# Patient Record
Sex: Female | Born: 1965 | Race: White | Hispanic: No | Marital: Married | Smoking: Never smoker
Health system: Southern US, Community
[De-identification: ages and names within clinical notes are randomized; demographics above are authoritative.]

## PROBLEM LIST (undated history)

## (undated) DIAGNOSIS — K5792 Diverticulitis of intestine, part unspecified, without perforation or abscess without bleeding: Secondary | ICD-10-CM

## (undated) DIAGNOSIS — T7840XA Allergy, unspecified, initial encounter: Secondary | ICD-10-CM

## (undated) HISTORY — PX: OTHER SURGICAL HISTORY: SHX169

## (undated) HISTORY — DX: Diverticulitis of intestine, part unspecified, without perforation or abscess without bleeding: K57.92

## (undated) HISTORY — DX: Allergy, unspecified, initial encounter: T78.40XA

---

## 2005-04-09 ENCOUNTER — Other Ambulatory Visit: Admission: RE | Admit: 2005-04-09 | Discharge: 2005-04-09 | Payer: Self-pay | Admitting: Internal Medicine

## 2005-07-02 DIAGNOSIS — K5792 Diverticulitis of intestine, part unspecified, without perforation or abscess without bleeding: Secondary | ICD-10-CM

## 2005-07-02 HISTORY — DX: Diverticulitis of intestine, part unspecified, without perforation or abscess without bleeding: K57.92

## 2005-07-29 ENCOUNTER — Inpatient Hospital Stay (HOSPITAL_COMMUNITY): Admission: EM | Admit: 2005-07-29 | Discharge: 2005-08-01 | Payer: Self-pay | Admitting: Emergency Medicine

## 2005-08-27 ENCOUNTER — Encounter: Admission: RE | Admit: 2005-08-27 | Discharge: 2005-08-27 | Payer: Self-pay | Admitting: Internal Medicine

## 2006-02-11 ENCOUNTER — Encounter: Admission: RE | Admit: 2006-02-11 | Discharge: 2006-02-11 | Payer: Self-pay | Admitting: Internal Medicine

## 2006-10-14 ENCOUNTER — Encounter: Payer: Self-pay | Admitting: Family Medicine

## 2007-02-03 ENCOUNTER — Encounter: Payer: Self-pay | Admitting: Family Medicine

## 2007-02-03 ENCOUNTER — Other Ambulatory Visit: Admission: RE | Admit: 2007-02-03 | Discharge: 2007-02-03 | Payer: Self-pay | Admitting: Family Medicine

## 2007-02-03 ENCOUNTER — Ambulatory Visit: Payer: Self-pay | Admitting: Family Medicine

## 2007-02-03 DIAGNOSIS — Z9189 Other specified personal risk factors, not elsewhere classified: Secondary | ICD-10-CM | POA: Insufficient documentation

## 2007-02-03 DIAGNOSIS — J309 Allergic rhinitis, unspecified: Secondary | ICD-10-CM | POA: Insufficient documentation

## 2007-02-03 DIAGNOSIS — Z8719 Personal history of other diseases of the digestive system: Secondary | ICD-10-CM | POA: Insufficient documentation

## 2007-02-04 ENCOUNTER — Ambulatory Visit: Payer: Self-pay | Admitting: Family Medicine

## 2007-02-04 LAB — CONVERTED CEMR LAB
Glucose, Urine, Semiquant: NEGATIVE
Nitrite: NEGATIVE
Protein, U semiquant: NEGATIVE
Urobilinogen, UA: 0.2
pH: 6

## 2007-02-07 LAB — CONVERTED CEMR LAB
AST: 21 units/L (ref 0–37)
Albumin: 4.1 g/dL (ref 3.5–5.2)
Alkaline Phosphatase: 37 units/L — ABNORMAL LOW (ref 39–117)
BUN: 10 mg/dL (ref 6–23)
Calcium: 9.4 mg/dL (ref 8.4–10.5)
Chloride: 105 meq/L (ref 96–112)
Eosinophils Absolute: 0 10*3/uL (ref 0.0–0.6)
GFR calc Af Amer: 117 mL/min
GFR calc non Af Amer: 97 mL/min
HCT: 37.8 % (ref 36.0–46.0)
Hemoglobin: 13.2 g/dL (ref 12.0–15.0)
MCHC: 34.8 g/dL (ref 30.0–36.0)
MCV: 97.1 fL (ref 78.0–100.0)
Neutro Abs: 2.6 10*3/uL (ref 1.4–7.7)
RDW: 12.2 % (ref 11.5–14.6)
Total Protein: 7.3 g/dL (ref 6.0–8.3)
VLDL: 15 mg/dL (ref 0–40)
WBC: 4.6 10*3/uL (ref 4.5–10.5)

## 2007-02-17 ENCOUNTER — Encounter: Admission: RE | Admit: 2007-02-17 | Discharge: 2007-02-17 | Payer: Self-pay | Admitting: Family Medicine

## 2007-11-11 ENCOUNTER — Ambulatory Visit: Payer: Self-pay | Admitting: Family Medicine

## 2007-11-11 DIAGNOSIS — J019 Acute sinusitis, unspecified: Secondary | ICD-10-CM | POA: Insufficient documentation

## 2008-01-23 ENCOUNTER — Telehealth: Payer: Self-pay | Admitting: *Deleted

## 2008-02-02 ENCOUNTER — Ambulatory Visit: Payer: Self-pay | Admitting: Family Medicine

## 2008-02-02 LAB — CONVERTED CEMR LAB
Bilirubin Urine: NEGATIVE
Blood in Urine, dipstick: NEGATIVE
Protein, U semiquant: NEGATIVE
Urobilinogen, UA: 0.2
pH: 6

## 2008-02-03 LAB — CONVERTED CEMR LAB
ALT: 27 units/L (ref 0–35)
AST: 22 units/L (ref 0–37)
Alkaline Phosphatase: 33 units/L — ABNORMAL LOW (ref 39–117)
BUN: 14 mg/dL (ref 6–23)
Basophils Absolute: 0 10*3/uL (ref 0.0–0.1)
Basophils Relative: 0.9 % (ref 0.0–3.0)
Bilirubin, Direct: 0.1 mg/dL (ref 0.0–0.3)
Chloride: 103 meq/L (ref 96–112)
Eosinophils Absolute: 0.1 10*3/uL (ref 0.0–0.7)
GFR calc Af Amer: 100 mL/min
HCT: 39.5 % (ref 36.0–46.0)
Hemoglobin: 13.8 g/dL (ref 12.0–15.0)
MCV: 98.8 fL (ref 78.0–100.0)
Monocytes Absolute: 0.5 10*3/uL (ref 0.1–1.0)
Neutro Abs: 2.9 10*3/uL (ref 1.4–7.7)
Neutrophils Relative %: 57.3 % (ref 43.0–77.0)
Potassium: 4 meq/L (ref 3.5–5.1)
RBC: 4 M/uL (ref 3.87–5.11)
RDW: 12.3 % (ref 11.5–14.6)
Sodium: 139 meq/L (ref 135–145)
Total Bilirubin: 1.3 mg/dL — ABNORMAL HIGH (ref 0.3–1.2)
Total CHOL/HDL Ratio: 4.1

## 2008-02-09 ENCOUNTER — Other Ambulatory Visit: Admission: RE | Admit: 2008-02-09 | Discharge: 2008-02-09 | Payer: Self-pay | Admitting: Family Medicine

## 2008-02-09 ENCOUNTER — Ambulatory Visit: Payer: Self-pay | Admitting: Family Medicine

## 2008-02-09 ENCOUNTER — Encounter: Payer: Self-pay | Admitting: Family Medicine

## 2008-02-18 ENCOUNTER — Encounter: Admission: RE | Admit: 2008-02-18 | Discharge: 2008-02-18 | Payer: Self-pay | Admitting: Family Medicine

## 2009-12-19 ENCOUNTER — Encounter: Admission: RE | Admit: 2009-12-19 | Discharge: 2009-12-19 | Payer: Self-pay | Admitting: Family Medicine

## 2009-12-22 ENCOUNTER — Encounter: Admission: RE | Admit: 2009-12-22 | Discharge: 2009-12-22 | Payer: Self-pay | Admitting: Family Medicine

## 2009-12-22 ENCOUNTER — Encounter: Payer: Self-pay | Admitting: Family Medicine

## 2010-02-06 ENCOUNTER — Ambulatory Visit: Payer: Self-pay | Admitting: Family Medicine

## 2010-02-06 LAB — CONVERTED CEMR LAB
Ketones, urine, test strip: NEGATIVE
Nitrite: NEGATIVE
Protein, U semiquant: NEGATIVE
WBC Urine, dipstick: NEGATIVE

## 2010-02-07 LAB — CONVERTED CEMR LAB
AST: 19 units/L (ref 0–37)
Albumin: 4 g/dL (ref 3.5–5.2)
Alkaline Phosphatase: 39 units/L (ref 39–117)
BUN: 14 mg/dL (ref 6–23)
Basophils Relative: 0.7 % (ref 0.0–3.0)
Bilirubin, Direct: 0.1 mg/dL (ref 0.0–0.3)
Calcium: 8.8 mg/dL (ref 8.4–10.5)
Chloride: 103 meq/L (ref 96–112)
Cholesterol: 183 mg/dL (ref 0–200)
Creatinine, Ser: 0.7 mg/dL (ref 0.4–1.2)
Eosinophils Absolute: 0.1 10*3/uL (ref 0.0–0.7)
Eosinophils Relative: 1.3 % (ref 0.0–5.0)
Glucose, Bld: 80 mg/dL (ref 70–99)
HCT: 39.5 % (ref 36.0–46.0)
MCHC: 34.5 g/dL (ref 30.0–36.0)
Monocytes Absolute: 0.5 10*3/uL (ref 0.1–1.0)
Neutrophils Relative %: 55.6 % (ref 43.0–77.0)
Platelets: 249 10*3/uL (ref 150.0–400.0)
Potassium: 4.1 meq/L (ref 3.5–5.1)
RBC: 4.03 M/uL (ref 3.87–5.11)
RDW: 13.4 % (ref 11.5–14.6)
Sodium: 139 meq/L (ref 135–145)
Total CHOL/HDL Ratio: 4

## 2010-02-14 ENCOUNTER — Ambulatory Visit: Payer: Self-pay | Admitting: Family Medicine

## 2010-06-21 ENCOUNTER — Encounter: Payer: Self-pay | Admitting: Family Medicine

## 2010-08-01 NOTE — Letter (Signed)
Summary: Records from [facility] 2002 - 2008  Records from [facility] 2002 - 2008   Imported By: Maryln Gottron 12/23/2009 10:51:57  _____________________________________________________________________  External Attachment:    Type:   Image     Comment:   External Document

## 2010-08-01 NOTE — Assessment & Plan Note (Signed)
Summary: CPX // RS   Vital Signs:  Patient profile:   45 year old female Weight:      131 pounds BMI:     24.05 BP sitting:   112 / 86  (left arm) Cuff size:   regular  Vitals Entered By: Raechel Ache, RN (February 14, 2010 9:02 AM) CC: CPX, labs done.    History of Present Illness: 45 yr old female for a cpx. She feels good and has no concerns. She is currently on her menses.   Allergies (verified): No Known Drug Allergies  Past History:  Past Medical History: Allergic rhinitis Diverticulitis, hx of. Hospitalized in 2007, had IV antibiotics. Had barium enema 2007-few diverticulae only. svd x 2                                                                                                                                                                       sees Dr. Campbell Stall yearly for skin checks.  Past Surgical History: Reviewed history from 02/03/2007 and no changes required. Denies surgical history  Family History: Reviewed history from 02/03/2007 and no changes required. Family History Diabetes 1st degree relative Family History High cholesterol Family History of Neurological disorder (daughter has cerebral palsy and father has multiple sclerosis)  Social History: Reviewed history from 02/03/2007 and no changes required. 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[employer] Married Never Smoked Alcohol use-no Regular exercise-yes  Review of Systems  The patient denies anorexia, fever, weight loss, weight gain, vision loss, decreased hearing, hoarseness, chest pain, syncope, dyspnea on exertion, peripheral edema, prolonged cough, headaches, hemoptysis, abdominal pain, melena, hematochezia, severe indigestion/heartburn, hematuria, incontinence, genital sores, muscle weakness, suspicious skin lesions, transient blindness, difficulty walking, depression, unusual weight change, abnormal bleeding, enlarged lymph nodes, angioedema, breast masses, and testicular  masses.    Physical Exam  General:  Well-developed,well-nourished,in no acute distress; alert,appropriate and cooperative throughout examination Head:  Normocephalic and atraumatic without obvious abnormalities. No apparent alopecia or balding. Eyes:  No corneal or conjunctival inflammation noted. EOMI. Perrla. Funduscopic exam benign, without hemorrhages, exudates or papilledema. Vision grossly normal. Ears:  External ear exam shows no significant lesions or deformities.  Otoscopic examination reveals clear canals, tympanic membranes are intact bilaterally without bulging, retraction, inflammation or discharge. Hearing is grossly normal bilaterally. Nose:  External nasal examination shows no deformity or inflammation. Nasal mucosa are pink and moist without lesions or exudates. Mouth:  Oral mucosa and oropharynx without lesions or exudates.  Teeth in good repair. Neck:  No deformities, masses, or tenderness noted. Chest Wall:  No deformities, masses, or tenderness noted. Lungs:  Normal respiratory effort, chest expands symmetrically. Lungs are clear to auscultation, no crackles or wheezes. Heart:  Normal rate and regular rhythm.  S1 and S2 normal without gallop, murmur, click, rub or other extra sounds. Abdomen:  Bowel sounds positive,abdomen soft and non-tender without masses, organomegaly or hernias noted. Msk:  No deformity or scoliosis noted of thoracic or lumbar spine.   Pulses:  R and L carotid,radial,femoral,dorsalis pedis and posterior tibial pulses are full and equal bilaterally Extremities:  No clubbing, cyanosis, edema, or deformity noted with normal full range of motion of all joints.   Neurologic:  No cranial nerve deficits noted. Station and gait are normal. Plantar reflexes are down-going bilaterally. DTRs are symmetrical throughout. Sensory, motor and coordinative functions appear intact. Skin:  Intact without suspicious lesions or rashes Cervical Nodes:  No lymphadenopathy  noted Axillary Nodes:  No palpable lymphadenopathy Inguinal Nodes:  No significant adenopathy Psych:  Cognition and judgment appear intact. Alert and cooperative with normal attention span and concentration. No apparent delusions, illusions, hallucinations   Impression & Recommendations:  Problem # 1:  EXAMINATION, ROUTINE MEDICAL (ICD-V70.0)  Complete Medication List: 1)  Multivitamins Tabs (Multiple vitamin) .Marland Kitchen.. 1 by mouth once daily  Patient Instructions: 1)  She will set up a Pap smear soon

## 2010-08-03 NOTE — Letter (Signed)
Summary: Flu shot record  Flu shot record   Imported By: Everrett Coombe 06/21/2010 13:10:57  _____________________________________________________________________  External Attachment:    Type:   Image     Comment:   External Document

## 2010-08-21 ENCOUNTER — Encounter: Payer: Self-pay | Admitting: Internal Medicine

## 2010-08-21 ENCOUNTER — Ambulatory Visit (INDEPENDENT_AMBULATORY_CARE_PROVIDER_SITE_OTHER): Payer: BC Managed Care – PPO | Admitting: Internal Medicine

## 2010-08-21 DIAGNOSIS — M542 Cervicalgia: Secondary | ICD-10-CM | POA: Insufficient documentation

## 2010-08-21 DIAGNOSIS — J069 Acute upper respiratory infection, unspecified: Secondary | ICD-10-CM | POA: Insufficient documentation

## 2010-08-21 MED ORDER — CYCLOBENZAPRINE HCL 5 MG PO TABS: 5.0000 mg | ORAL_TABLET | Freq: Three times a day (TID) | ORAL | Status: AC | PRN

## 2010-08-21 NOTE — Progress Notes (Signed)
  Subjective:    Patient ID: Kristen Farley, female    DOB: 07-02-1966, 45 y.o.   MRN: [id number]  HPI Pt is a pleasant 46 yo female who presents to clinic for evaluation of neck pain. States 1 wk h/o right anterolateral neck pain and trapezieus pain. No h/o injury or trauma. No radicular sx of arm or numbness/tingling. Has attempted tylenol prn, massager to neck, heating pad and neck exercises without significant improvement. Also notes 2wk h/o rhinorrhea and cough now NP. States sx are improving spontaneously and has no f/c. +sick exposures with two family members. No alleviating or exacerbating factors. No other current complaints.  Reviewed PMH, medications and allergies.     Review of Systems  Constitutional: Negative for fever, chills and fatigue.  HENT: Positive for congestion and rhinorrhea. Negative for ear pain.   Respiratory: Positive for cough. Negative for shortness of breath and wheezing.   Musculoskeletal: Positive for myalgias. Negative for back pain and arthralgias.  Skin: Negative for rash.       Objective:   Physical Exam  Constitutional: She appears well-developed and well-nourished. No distress.  HENT:  Head: Normocephalic and atraumatic.  Right Ear: External ear normal.  Left Ear: External ear normal.  Nose: Nose normal.  Mouth/Throat: Oropharynx is clear and moist. No oropharyngeal exudate.  Eyes: Conjunctivae are normal. No scleral icterus.  Neck: Neck supple.  Musculoskeletal:       Muscle tenderness right anterolateral neck in muscle distribution. No current overt spasm. FROM of neck. No midline post neck tenderness or bony abn. +tenderness to palpation right trapezius muscle. FROM right arm.  Skin: Skin is warm and dry. No rash noted. She is not diaphoretic. No erythema.          Assessment & Plan:

## 2010-08-21 NOTE — Assessment & Plan Note (Signed)
S/s most suggestive of msk etiology with muscle pain and spasm. Continue heating pad prn. Attempt flexeril prn and cautioned regarding possible sedating effect. Attempt otc nsaid with food and no other nsaids. Followup if no improvement or worsening.

## 2010-08-21 NOTE — Assessment & Plan Note (Signed)
Spontaneous improvement. Suspect resolving viral infxn. OTC sx relief prn. Followup if no improvement or worsening.

## 2010-11-17 NOTE — H&P (Signed)
[name], [name] NO.:  [id number]   MEDICAL RECORD NO.:  [id number]          PATIENT TYPE:  EMS   LOCATION:  ED                           FACILITY:  [facility]   PHYSICIAN:  Theressa Millard, M.D.    DATE OF BIRTH:  29-Sep-1965   DATE OF ADMISSION:  07/29/2005  DATE OF DISCHARGE:                                HISTORY & PHYSICAL   Kristen Farley is a 45 year old white female admitted with an inflammatory mass  in the right colon.  She developed flu-like symptoms 2 days ago.  Yesterday  had generalized abdominal discomfort.  Today and last night had increasing  pain localizing to the right abdomen.  No nausea and vomiting. She is  passing flatus.  No bowel movement x2 days.  She has no past history of  similar problems, and she has never had an appendectomy.   PAST MEDICAL HISTORY:  Medical: None.   Surgical: Plastic surgery, repair of lacerations to the knee.   ALLERGIES:  No known drug allergies.   MEDICATIONS:  None.   SOCIAL HISTORY:  Cigarettes: None.  Alcohol: Rare.  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   FAMILY HISTORY:  Father has MS.  Daughter has cerebral palsy.  No family  history of colon cancer.   REVIEW OF SYSTEMS:  All other systems are negative.   PHYSICAL EXAMINATION:  GENERAL: Well-developed, well-nourished, in no  distress.  VITAL SIGNS:  Blood pressure 117/86, pulse 93, temperature 98.8.  HEENT:  She has pupils which are round and reactive to light.  Extraocular  movements are intact.  Ears are normal.  Nose and throat are unremarkable.  NECK: Supple.  Thyroid is not enlarged or tender.  CHEST: Clear to auscultation and percussion.  CARDIAC:  Normal S1 and S2 without S3, S4, murmur, rub, or click.  ABDOMEN: Soft with moderate tenderness on the right and mild tenderness on  the left.  There is localized rebound tenderness on the right.   LABORATORY DATA:  White count 14,000, hemoglobin 14. UA negative.  Urine  pregnancy test negative.   BMET normal.  LFTs are normal except bilirubin of  1.6.   CT scan shows an inflammatory mass on the right colon.  Cannot completely  exclude a tumor.   IMPRESSION:  Inflammatory mass on the right colon. Differential diagnoses  include right-sided diverticulosis plus or minus abscess, colitis, other  inflammatory process.  Doubt tumor or cancer.   PLAN:  1.  The patient will be admitted.  2.  Given IV fluids and help n.p.o. except for ice chips.  3.  Treat with Flagyl and Rocephin.  4.  Surgical consultation will be obtained.      Theressa Millard, M.D.  Electronically Signed     JO/MEDQ  D:  07/29/2005  T:  07/29/2005  Job:  [id number]   cc:   Marcene Duos, M.D.  Fax: [phone]

## 2010-11-17 NOTE — Discharge Summary (Signed)
Kristen Farley, Kristen Farley                ACCOUNT NO.:  [id number]   MEDICAL RECORD NO.:  [id number]          PATIENT TYPE:  INP   LOCATION:  1606                         FACILITY:  [facility]   PHYSICIAN:  Theressa Millard, M.D.    DATE OF BIRTH:  1965-12-18   DATE OF ADMISSION:  07/29/2005  DATE OF DISCHARGE:  08/01/2005                                 DISCHARGE SUMMARY   ADMISSION DIAGNOSIS:  Inflammatory mass, right colon.   DISCHARGE DIAGNOSIS:  Inflammatory mass, right colon, ? etiology.   The patient is a 45 year old white female who felt flulike symptoms 2 days  prior to admission and then developed increasing abdominal pain and came to  the emergency department.   HOSPITAL COURSE:  The patient was admitted after a CT scan showed evidence  of an inflammatory mass in the right colon. A tumor could not be completely  excluded. Differential diagnosis included right sided diverticulitis,  plus/minus abscess, other colitis or other inflammatory process. She was  admitted, held n.p.o., treated with Unasyn and Flagyl. Over the subsequent  several days, she improved considerably with dramatic improvement in her  discomfort and pain. Her white count which was initially mildly elevated at  14,000 returned to normal. She was able to eat and not have significant  problems and so she was discharged in improved condition.   DISCHARGE MEDICATIONS:  1.  Augmentin 875 mg b.i.d. x7 days.  2.  Flagyl 250 mg t.i.d. x7 days.   DIET:  She is to eat a low residue diet.   FOLLOW UP:  She will call to make an appointment to see Marcene Duos, M.D. in 1-2 weeks as well as Anselm Pancoast. Weatherly, M.D. in 1-2  weeks.   ACTIVITY:  As tolerated.      Theressa Millard, M.D.  Electronically Signed     JO/MEDQ  D:  09/07/2005  T:  09/07/2005  Job:  [id number]

## 2011-01-10 ENCOUNTER — Other Ambulatory Visit: Payer: Self-pay | Admitting: Family Medicine

## 2011-01-10 DIAGNOSIS — Z1231 Encounter for screening mammogram for malignant neoplasm of breast: Secondary | ICD-10-CM

## 2011-01-16 ENCOUNTER — Ambulatory Visit
Admission: RE | Admit: 2011-01-16 | Discharge: 2011-01-16 | Disposition: A | Discharge status: Home / Self Care | Payer: BC Managed Care – PPO | Source: Ambulatory Visit | Attending: Family Medicine | Admitting: Family Medicine

## 2011-01-16 DIAGNOSIS — Z1231 Encounter for screening mammogram for malignant neoplasm of breast: Secondary | ICD-10-CM

## 2011-07-17 ENCOUNTER — Encounter: Payer: Self-pay | Admitting: Family

## 2011-07-17 ENCOUNTER — Ambulatory Visit (INDEPENDENT_AMBULATORY_CARE_PROVIDER_SITE_OTHER): Payer: BC Managed Care – PPO | Admitting: Family

## 2011-07-17 DIAGNOSIS — Z23 Encounter for immunization: Secondary | ICD-10-CM

## 2011-07-17 DIAGNOSIS — L259 Unspecified contact dermatitis, unspecified cause: Secondary | ICD-10-CM

## 2011-07-17 MED ORDER — METHYLPREDNISOLONE ACETATE 80 MG/ML IJ SUSP
80.0000 mg | Freq: Once | INTRAMUSCULAR | Status: AC
  Administered 2011-07-17: 80 mg via INTRAMUSCULAR

## 2011-07-17 NOTE — Patient Instructions (Signed)
Contact Dermatitis Contact dermatitis is a reaction to certain substances that touch the skin. Contact dermatitis can be either irritant contact dermatitis or allergic contact dermatitis. Irritant contact dermatitis does not require previous exposure to the substance for a reaction to occur. Allergic contact dermatitis only occurs if you have been exposed to the substance before. Upon a repeat exposure, your body reacts to the substance.   CAUSES   Many substances can cause contact dermatitis. Irritant dermatitis is most commonly caused by repeated exposure to mildly irritating substances, such as:  Makeup.     Soaps.    Detergents.    Bleaches.    Acids.    Metal salts, such as nickel.  Allergic contact dermatitis is most commonly caused by exposure to:  Poisonous plants.     Chemicals (deodorants, shampoos).     Jewelry.    Latex.    Neomycin in triple antibiotic cream.     Preservatives in products, including clothing.  SYMPTOMS   The area of skin that is exposed may develop:  Dryness or flaking.     Redness.    Cracks.    Itching.    Pain or a burning sensation.     Blisters.  With allergic contact dermatitis, there may also be swelling in areas such as the eyelids, mouth, or genitals.   DIAGNOSIS   Your caregiver can usually tell what the problem is by doing a physical exam. In cases where the cause is uncertain and an allergic contact dermatitis is suspected, a patch skin test may be performed to help determine the cause of your dermatitis. TREATMENT Treatment includes protecting the skin from further contact with the irritating substance by avoiding that substance if possible. Barrier creams, powders, and gloves may be helpful. Your caregiver may also recommend:  Steroid creams or ointments applied 2 times daily. For best results, soak the rash area in cool water for 20 minutes. Then apply the medicine. Cover the area with a plastic wrap. You can store the  steroid cream in the refrigerator for a "chilly" effect on your rash. That may decrease itching. Oral steroid medicines may be needed in more severe cases.     Antibiotics or antibacterial ointments if a skin infection is present.     Antihistamine lotion or an antihistamine taken by mouth to ease itching.     Lubricants to keep moisture in your skin.     Burow's solution to reduce redness and soreness or to dry a weeping rash. Mix one packet or tablet of solution in 2 cups cool water. Dip a clean washcloth in the mixture, wring it out a bit, and put it on the affected area. Leave the cloth in place for 30 minutes. Do this as often as possible throughout the day.     Taking several cornstarch or baking soda baths daily if the area is too large to cover with a washcloth.  Harsh chemicals, such as alkalis or acids, can cause skin damage that is like a burn. You should flush your skin for 15 to 20 minutes with cold water after such an exposure. You should also seek immediate medical care after exposure. Bandages (dressings), antibiotics, and pain medicine may be needed for severely irritated skin.   HOME CARE INSTRUCTIONS  Avoid the substance that caused your reaction.     Keep the area of skin that is affected away from hot water, soap, sunlight, chemicals, acidic substances, or anything else that would irritate your skin.       Do not scratch the rash. Scratching may cause the rash to become infected.     You may take cool baths to help stop the itching.     Only take over-the-counter or prescription medicines as directed by your caregiver.     See your caregiver for follow-up care as directed to make sure your skin is healing properly.  SEEK MEDICAL CARE IF:    Your condition is not better after 3 days of treatment.     You seem to be getting worse.     You see signs of infection such as swelling, tenderness, redness, soreness, or warmth in the affected area.     You have any problems  related to your medicines.  Document Released: 06/15/2000 Document Revised: 02/28/2011 Document Reviewed: 11/21/2010 [employer] Patient Information 2012 [employer]. 

## 2011-07-17 NOTE — Progress Notes (Signed)
Subjective:    Patient ID: Kristen Farley, female    DOB: 1965/12/23, 46 y.o.   MRN: [id number]  HPI 46 year old white female, nonsmoker, patient of Dr. Clent Ridges is in today with complaints for it she swollen, red face. She woke up this morning with this reaction. She is unsure what the reaction is to. Shortly after she began to have tongue swelling that has resolved after taking Benadryl 25 mg. She denies any changes in detergents, soaps, lotions, medications, or environmental changes.   Review of Systems  Constitutional: Negative.   HENT: Positive for facial swelling.        Face red and mildly swollen  Eyes: Negative.   Respiratory: Negative.  Negative for shortness of breath, wheezing and stridor.   Cardiovascular: Negative.  Negative for chest pain.  Genitourinary: Negative.   Musculoskeletal: Negative.   Skin: Negative.   Neurological: Negative.   Hematological: Negative.   Psychiatric/Behavioral: Negative.        Past Medical History  Diagnosis Date  . Allergy   . Diverticulitis 2007    History   Social History  . Marital Status: Married    Spouse Name: N/A    Number of Children: N/A  . Years of Education: N/A   Occupational History  . Not on file.   Social History Main Topics  . Smoking status: Never Smoker   . Smokeless tobacco: Not on file  . Alcohol Use: Not on file  . Drug Use: Not on file  . Sexually Active: Not on file   Other Topics Concern  . Not on file   Social History Narrative  . No narrative on file    No past surgical history on file.  Family History  Problem Relation Age of Onset  . Multiple sclerosis Father   . Cerebral palsy Daughter   . Diabetes      family hx  . Hyperlipidemia      family hx    No Known Allergies  Current Outpatient Prescriptions on File Prior to Visit  Medication Sig Dispense Refill  . Multiple Vitamin (MULTIVITAMIN) tablet Take 1 tablet by mouth daily.        . cyclobenzaprine (FLEXERIL) 5 MG tablet Take 1  tablet (5 mg total) by mouth every 8 (eight) hours as needed for Muscle spasms.  30 tablet  0   No current facility-administered medications on file prior to visit.    BP 138/100  Temp(Src) 97.8 F (36.6 C) (Oral)  Wt 146 lb (66.225 kg)chart Objective:   Physical Exam  Constitutional: She is oriented to person, place, and time. She appears well-developed and well-nourished.  HENT:  Right Ear: External ear normal.  Left Ear: External ear normal.  Nose: Nose normal.  Mouth/Throat: Oropharynx is clear and moist.       Mild facial swelling and redness noted. Tongue normal. Good air movement through the neck.  Neck: Normal range of motion. Neck supple.       Red rash noted to the neck.  Cardiovascular: Normal rate, regular rhythm and normal heart sounds.   Pulmonary/Chest: Effort normal and breath sounds normal. No respiratory distress. She has no wheezes.  Musculoskeletal: Normal range of motion.  Neurological: She is alert and oriented to person, place, and time.  Skin: Skin is warm and dry. Rash noted. There is erythema.       Erythematous, rash noted to the face and neck.          Assessment & Plan:  Assessment:  Allergic reaction, angioedema-mild  Plan: Depo-Medrol 80 mg IM x1. Continue Benadryl 25 mg when necessary. Patient has no signs or symptoms of airway obstruction but advised if she becomes short of breath or swelling worsened she is to go to the emergency room immediately. Followup with PCP as scheduled and sooner when necessary.

## 2011-07-18 ENCOUNTER — Telehealth: Payer: Self-pay | Admitting: *Deleted

## 2011-07-18 MED ORDER — PREDNISONE 20 MG PO TABS: ORAL_TABLET | ORAL | Status: AC

## 2011-07-18 NOTE — Telephone Encounter (Signed)
Faxed RX for prednisone in to the pharmacy. Please start taking med. Advise to be aware of possible triggers.

## 2011-07-18 NOTE — Telephone Encounter (Signed)
Pt is calling back today stating she is having exactly the same symptoms as yesterday with the facial swelling, itching, etc.  She has taken 50 mg. Of Benadryl, but would like to know what Oran Rein thinks she should do??

## 2011-12-25 ENCOUNTER — Other Ambulatory Visit: Payer: Self-pay | Admitting: Family Medicine

## 2011-12-25 DIAGNOSIS — Z1231 Encounter for screening mammogram for malignant neoplasm of breast: Secondary | ICD-10-CM

## 2012-01-29 ENCOUNTER — Ambulatory Visit
Admission: RE | Admit: 2012-01-29 | Discharge: 2012-01-29 | Disposition: A | Discharge status: Home / Self Care | Payer: BC Managed Care – PPO | Source: Ambulatory Visit | Attending: Family Medicine | Admitting: Family Medicine

## 2012-01-29 DIAGNOSIS — Z1231 Encounter for screening mammogram for malignant neoplasm of breast: Secondary | ICD-10-CM

## 2012-02-21 ENCOUNTER — Encounter: Payer: Self-pay | Admitting: Family Medicine

## 2012-02-21 ENCOUNTER — Ambulatory Visit (INDEPENDENT_AMBULATORY_CARE_PROVIDER_SITE_OTHER): Payer: BC Managed Care – PPO | Admitting: Family Medicine

## 2012-02-21 VITALS — BP 120/76 | HR 75 | Temp 98.4°F | Wt 132.0 lb

## 2012-02-21 DIAGNOSIS — L259 Unspecified contact dermatitis, unspecified cause: Secondary | ICD-10-CM

## 2012-02-21 MED ORDER — METHYLPREDNISOLONE ACETATE 80 MG/ML IJ SUSP
120.0000 mg | Freq: Once | INTRAMUSCULAR | Status: AC
  Administered 2012-02-21: 120 mg via INTRAMUSCULAR

## 2012-02-21 NOTE — Progress Notes (Signed)
  Subjective:    Patient ID: Kristen Farley, female    DOB: [birth year], 46 y.o.   MRN: [id number]  HPI Here for 3 days of itchy spots on the face, neck , and arms. She was working in the yard last weekend.   Review of Systems  Constitutional: Negative.   Skin: Positive for rash.       Objective:   Physical Exam  Constitutional: She appears well-developed and well-nourished.  Skin:       Patches of red papules and vesicles as above           Assessment & Plan:  Add Zyrtec bid

## 2012-02-21 NOTE — Addendum Note (Signed)
Addended by: Aniceto Boss A on: 02/21/2012 10:17 AM   Modules accepted: Orders

## 2012-07-08 ENCOUNTER — Ambulatory Visit: Payer: BC Managed Care – PPO | Admitting: Family Medicine

## 2012-07-09 ENCOUNTER — Ambulatory Visit (INDEPENDENT_AMBULATORY_CARE_PROVIDER_SITE_OTHER): Payer: BC Managed Care – PPO | Admitting: Family Medicine

## 2012-07-09 DIAGNOSIS — Z23 Encounter for immunization: Secondary | ICD-10-CM

## 2012-12-22 ENCOUNTER — Other Ambulatory Visit (INDEPENDENT_AMBULATORY_CARE_PROVIDER_SITE_OTHER): Payer: BC Managed Care – PPO

## 2012-12-22 DIAGNOSIS — Z0000 Encounter for general adult medical examination without abnormal findings: Secondary | ICD-10-CM

## 2012-12-22 LAB — POCT URINALYSIS DIPSTICK
Bilirubin, UA: NEGATIVE
Glucose, UA: NEGATIVE
Ketones, UA: NEGATIVE
Nitrite, UA: NEGATIVE

## 2012-12-22 LAB — CBC WITH DIFFERENTIAL/PLATELET
Basophils Absolute: 0 10*3/uL (ref 0.0–0.1)
Basophils Relative: 0.8 % (ref 0.0–3.0)
Eosinophils Relative: 2.4 % (ref 0.0–5.0)
Lymphocytes Relative: 32.3 % (ref 12.0–46.0)
MCHC: 33.8 g/dL (ref 30.0–36.0)
Neutro Abs: 2.5 10*3/uL (ref 1.4–7.7)
Neutrophils Relative %: 54.1 % (ref 43.0–77.0)
RBC: 4.13 Mil/uL (ref 3.87–5.11)
WBC: 4.6 10*3/uL (ref 4.5–10.5)

## 2012-12-22 LAB — LIPID PANEL
Total CHOL/HDL Ratio: 3
Triglycerides: 105 mg/dL (ref 0.0–149.0)
VLDL: 21 mg/dL (ref 0.0–40.0)

## 2012-12-22 LAB — HEPATIC FUNCTION PANEL
Albumin: 4 g/dL (ref 3.5–5.2)
Total Bilirubin: 1 mg/dL (ref 0.3–1.2)

## 2012-12-22 LAB — BASIC METABOLIC PANEL
BUN: 11 mg/dL (ref 6–23)
Chloride: 103 mEq/L (ref 96–112)
Creatinine, Ser: 0.7 mg/dL (ref 0.4–1.2)
Glucose, Bld: 92 mg/dL (ref 70–99)
Potassium: 4.2 mEq/L (ref 3.5–5.1)

## 2012-12-22 LAB — LDL CHOLESTEROL, DIRECT: Direct LDL: 119.2 mg/dL

## 2012-12-25 NOTE — Progress Notes (Signed)
Quick Note:  Pt has appointment on 12/29/12 will go over then. ______

## 2012-12-29 ENCOUNTER — Ambulatory Visit (INDEPENDENT_AMBULATORY_CARE_PROVIDER_SITE_OTHER): Payer: BC Managed Care – PPO | Admitting: Family Medicine

## 2012-12-29 ENCOUNTER — Encounter: Payer: Self-pay | Admitting: Family Medicine

## 2012-12-29 ENCOUNTER — Other Ambulatory Visit (HOSPITAL_COMMUNITY)
Admission: RE | Admit: 2012-12-29 | Discharge: 2012-12-29 | Disposition: A | Discharge status: Home / Self Care | Payer: BC Managed Care – PPO | Source: Ambulatory Visit | Attending: Family Medicine | Admitting: Family Medicine

## 2012-12-29 VITALS — BP 120/74 | HR 84 | Temp 98.4°F | Ht 62.5 in | Wt 131.0 lb

## 2012-12-29 DIAGNOSIS — Z0000 Encounter for general adult medical examination without abnormal findings: Secondary | ICD-10-CM

## 2012-12-29 DIAGNOSIS — Z01419 Encounter for gynecological examination (general) (routine) without abnormal findings: Secondary | ICD-10-CM | POA: Insufficient documentation

## 2012-12-29 DIAGNOSIS — N920 Excessive and frequent menstruation with regular cycle: Secondary | ICD-10-CM

## 2012-12-29 DIAGNOSIS — H9190 Unspecified hearing loss, unspecified ear: Secondary | ICD-10-CM

## 2012-12-29 DIAGNOSIS — H9193 Unspecified hearing loss, bilateral: Secondary | ICD-10-CM

## 2012-12-29 NOTE — Progress Notes (Signed)
Subjective:    Patient ID: Kristen Farley, female    DOB: 08/25/1965, 47 y.o.   MRN: [id number]  HPI  47 yr old female for a cpx. She has two issues to discuss. First her menses have become very irregular over the past year. She often skips a month or two in between cycles. Sometimes her bleeding is light and sometimes it is heavy and has a lot of clotting. Her cramps are no more intense than usual. She recently had 6 weeks of frequent light bleeding which stopped a few days ago. No hot flashes. Second, she asks about decreased hearing, usually in crowded rooms, and about ringing in the ears over the past year. No ear pain or nausea or dizziness.    Review of Systems  Constitutional: Negative.  Negative for fever, diaphoresis, activity change, appetite change, fatigue and unexpected weight change.  HENT: Positive for hearing loss and tinnitus. Negative for ear pain, nosebleeds, congestion, sore throat, trouble swallowing, neck pain, neck stiffness and voice change.   Eyes: Negative.  Negative for photophobia, pain, discharge, redness and visual disturbance.  Respiratory: Negative.  Negative for apnea, cough, choking, chest tightness, shortness of breath, wheezing and stridor.   Cardiovascular: Negative.  Negative for chest pain, palpitations and leg swelling.  Gastrointestinal: Negative.  Negative for nausea, vomiting, abdominal pain, diarrhea, constipation, blood in stool, abdominal distention and rectal pain.  Genitourinary: Positive for vaginal bleeding and menstrual problem. Negative for dysuria, urgency, frequency, hematuria, flank pain, vaginal discharge, enuresis, difficulty urinating and vaginal pain.  Musculoskeletal: Negative.  Negative for myalgias, back pain, joint swelling, arthralgias and gait problem.  Skin: Negative.  Negative for color change, pallor, rash and wound.  Neurological: Negative.  Negative for dizziness, tremors, seizures, syncope, speech difficulty, weakness,  light-headedness, numbness and headaches.  Hematological: Negative for adenopathy. Does not bruise/bleed easily.  Psychiatric/Behavioral: Negative.  Negative for hallucinations, behavioral problems, confusion, sleep disturbance, dysphoric mood and agitation. The patient is not nervous/anxious.        Objective:   Physical Exam  Constitutional: She appears well-developed and well-nourished. No distress.  HENT:  Head: Normocephalic and atraumatic.  Right Ear: External ear normal.  Left Ear: External ear normal.  Nose: Nose normal.  Mouth/Throat: Oropharynx is clear and moist. No oropharyngeal exudate.  Eyes: Conjunctivae and EOM are normal. Pupils are equal, round, and reactive to light. Right eye exhibits no discharge. Left eye exhibits no discharge. No scleral icterus.  Neck: Normal range of motion. Neck supple. No JVD present. No thyromegaly present.  Cardiovascular: Normal rate, regular rhythm, normal heart sounds and intact distal pulses.  Exam reveals no gallop and no friction rub.   No murmur heard. Pulmonary/Chest: Effort normal and breath sounds normal. No stridor. No respiratory distress. She has no wheezes. She has no rales. She exhibits no tenderness.  Abdominal: Soft. Normal appearance and bowel sounds are normal. She exhibits no distension, no abdominal bruit, no ascites and no mass. There is no hepatosplenomegaly. There is no tenderness. There is no rigidity, no rebound and no guarding. No hernia.  Genitourinary: Rectum normal, vagina normal and uterus normal. No breast swelling, tenderness, discharge or bleeding. Cervix exhibits no motion tenderness, no discharge and no friability. Right adnexum displays no mass, no tenderness and no fullness. Left adnexum displays no mass, no tenderness and no fullness. No erythema, tenderness or bleeding around the vagina. No vaginal discharge found.  Musculoskeletal: Normal range of motion. She exhibits no edema and no tenderness.   Lymphadenopathy:  She has no cervical adenopathy.  Neurological: She is alert. She has normal reflexes. No cranial nerve deficit. She exhibits normal muscle tone. Coordination normal.  Skin: Skin is warm and dry. No rash noted. She is not diaphoretic. No erythema. No pallor.  Psychiatric: She has a normal mood and affect. Her behavior is normal. Judgment and thought content normal.          Assessment & Plan:  Well exam. 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 We will refer to ENT for audiologic evaluation. She is probably perimenopausal, and we discussed possible treatments like a dose dose BCP. She elected to not treat at this time. We will set up an [place] soon to evaluate her uterus and its endometrium. Pap smear is pending.

## 2013-01-05 MED ORDER — FLUCONAZOLE 150 MG PO TABS: 150.0000 mg | ORAL_TABLET | Freq: Once | ORAL | Status: DC

## 2013-01-05 NOTE — Addendum Note (Signed)
Addended by: Aniceto Boss A on: 01/05/2013 10:35 AM   Modules accepted: Orders

## 2013-01-05 NOTE — Progress Notes (Signed)
Quick Note:  I spoke with pt and sent script e-scribe. ______ 

## 2013-01-12 ENCOUNTER — Telehealth: Payer: Self-pay | Admitting: Family Medicine

## 2013-01-12 DIAGNOSIS — N920 Excessive and frequent menstruation with regular cycle: Secondary | ICD-10-CM

## 2013-01-12 NOTE — Telephone Encounter (Signed)
Had to add on a [place] of pelvis to [place] trans-vag order, which I did.

## 2013-01-14 ENCOUNTER — Other Ambulatory Visit: Payer: BC Managed Care – PPO

## 2013-01-19 ENCOUNTER — Ambulatory Visit
Admission: RE | Admit: 2013-01-19 | Discharge: 2013-01-19 | Disposition: A | Discharge status: Home / Self Care | Payer: BC Managed Care – PPO | Source: Ambulatory Visit | Attending: Family Medicine | Admitting: Family Medicine

## 2013-01-19 DIAGNOSIS — N920 Excessive and frequent menstruation with regular cycle: Secondary | ICD-10-CM

## 2013-01-20 NOTE — Progress Notes (Signed)
Quick Note:  I spoke with pt ______ 

## 2013-01-22 ENCOUNTER — Other Ambulatory Visit: Payer: Self-pay

## 2013-01-22 DIAGNOSIS — Z1231 Encounter for screening mammogram for malignant neoplasm of breast: Secondary | ICD-10-CM

## 2013-02-12 ENCOUNTER — Ambulatory Visit
Admission: RE | Admit: 2013-02-12 | Discharge: 2013-02-12 | Disposition: A | Discharge status: Home / Self Care | Payer: BC Managed Care – PPO | Source: Ambulatory Visit

## 2013-02-12 DIAGNOSIS — Z1231 Encounter for screening mammogram for malignant neoplasm of breast: Secondary | ICD-10-CM

## 2013-05-07 ENCOUNTER — Other Ambulatory Visit: Payer: Self-pay

## 2013-12-30 ENCOUNTER — Other Ambulatory Visit (INDEPENDENT_AMBULATORY_CARE_PROVIDER_SITE_OTHER): Payer: BC Managed Care – PPO

## 2013-12-30 DIAGNOSIS — Z0000 Encounter for general adult medical examination without abnormal findings: Secondary | ICD-10-CM

## 2013-12-30 LAB — BASIC METABOLIC PANEL
BUN: 11 mg/dL (ref 6–23)
CALCIUM: 8.8 mg/dL (ref 8.4–10.5)
CO2: 27 mEq/L (ref 19–32)
Chloride: 101 mEq/L (ref 96–112)
Creatinine, Ser: 0.6 mg/dL (ref 0.4–1.2)
GFR: 106.27 mL/min (ref >60.00)
GLUCOSE: 81 mg/dL (ref 70–99)
Potassium: 3.5 mEq/L (ref 3.5–5.1)
SODIUM: 137 meq/L (ref 135–145)

## 2013-12-30 LAB — LIPID PANEL
Cholesterol: 210 mg/dL — ABNORMAL HIGH (ref 0–200)
HDL: 73.1 mg/dL
LDL Cholesterol: 112 mg/dL — ABNORMAL HIGH (ref 0–99)
NonHDL: 136.9
Total CHOL/HDL Ratio: 3
Triglycerides: 124 mg/dL (ref 0.0–149.0)
VLDL: 24.8 mg/dL (ref 0.0–40.0)

## 2013-12-30 LAB — CBC WITH DIFFERENTIAL/PLATELET
Basophils Absolute: 0 K/uL (ref 0.0–0.1)
Basophils Relative: 0.5 % (ref 0.0–3.0)
Eosinophils Absolute: 0.1 K/uL (ref 0.0–0.7)
Eosinophils Relative: 1.8 % (ref 0.0–5.0)
HCT: 40.3 % (ref 36.0–46.0)
Hemoglobin: 13.6 g/dL (ref 12.0–15.0)
Lymphocytes Relative: 28.4 % (ref 12.0–46.0)
Lymphs Abs: 1.6 K/uL (ref 0.7–4.0)
MCHC: 33.8 g/dL (ref 30.0–36.0)
MCV: 97 fl (ref 78.0–100.0)
Monocytes Absolute: 0.6 K/uL (ref 0.1–1.0)
Monocytes Relative: 11.6 % (ref 3.0–12.0)
Neutro Abs: 3.2 K/uL (ref 1.4–7.7)
Neutrophils Relative %: 57.7 % (ref 43.0–77.0)
Platelets: 257 K/uL (ref 150.0–400.0)
RBC: 4.16 Mil/uL (ref 3.87–5.11)
RDW: 13.6 % (ref 11.5–15.5)
WBC: 5.6 K/uL (ref 4.0–10.5)

## 2013-12-30 LAB — POCT URINALYSIS DIPSTICK
Bilirubin, UA: NEGATIVE
Blood, UA: NEGATIVE
Glucose, UA: NEGATIVE
Ketones, UA: NEGATIVE
Leukocytes, UA: NEGATIVE
Nitrite, UA: NEGATIVE
Protein, UA: NEGATIVE
Spec Grav, UA: 1.015
Urobilinogen, UA: 0.2
pH, UA: 7.5

## 2013-12-30 LAB — HEPATIC FUNCTION PANEL
ALT: 38 U/L — ABNORMAL HIGH (ref 0–35)
AST: 38 U/L — AB (ref 0–37)
Albumin: 3.9 g/dL (ref 3.5–5.2)
Alkaline Phosphatase: 37 U/L — ABNORMAL LOW (ref 39–117)
BILIRUBIN DIRECT: 0.1 mg/dL (ref 0.0–0.3)
BILIRUBIN TOTAL: 0.7 mg/dL (ref 0.2–1.2)
Total Protein: 7 g/dL (ref 6.0–8.3)

## 2013-12-30 LAB — TSH: TSH: 2.83 u[IU]/mL (ref 0.35–4.50)

## 2014-01-13 ENCOUNTER — Encounter: Payer: Self-pay | Admitting: Family Medicine

## 2014-01-13 ENCOUNTER — Ambulatory Visit (INDEPENDENT_AMBULATORY_CARE_PROVIDER_SITE_OTHER): Payer: BC Managed Care – PPO | Admitting: Family Medicine

## 2014-01-13 VITALS — BP 122/82 | HR 83 | Temp 98.5°F | Ht 62.75 in | Wt 136.0 lb

## 2014-01-13 DIAGNOSIS — Z0000 Encounter for general adult medical examination without abnormal findings: Secondary | ICD-10-CM

## 2014-01-13 NOTE — Progress Notes (Signed)
Pre visit review using our clinic review tool, if applicable. No additional management support is needed unless otherwise documented below in the visit note. 

## 2014-01-13 NOTE — Progress Notes (Signed)
   Subjective:    Patient ID: Kristen Farley, female    DOB: [birth year], 48 y.o.   MRN: [id number]  HPI 48 yr old female for a cpx. She feels well. She plays tennis and walks for exercise.    Review of Systems  Constitutional: Negative.   HENT: Negative.   Eyes: Negative.   Respiratory: Negative.   Cardiovascular: Negative.   Gastrointestinal: Negative.   Genitourinary: Negative for dysuria, urgency, frequency, hematuria, flank pain, decreased urine volume, enuresis, difficulty urinating, pelvic pain and dyspareunia.  Musculoskeletal: Negative.   Skin: Negative.   Neurological: Negative.   Psychiatric/Behavioral: Negative.        Objective:   Physical Exam  Constitutional: She is oriented to person, place, and time. She appears well-developed and well-nourished. No distress.  HENT:  Head: Normocephalic and atraumatic.  Right Ear: External ear normal.  Left Ear: External ear normal.  Nose: Nose normal.  Mouth/Throat: Oropharynx is clear and moist. No oropharyngeal exudate.  Eyes: Conjunctivae and EOM are normal. Pupils are equal, round, and reactive to light. No scleral icterus.  Neck: Normal range of motion. Neck supple. No JVD present. No thyromegaly present.  Cardiovascular: Normal rate, regular rhythm, normal heart sounds and intact distal pulses.  Exam reveals no gallop and no friction rub.   No murmur heard. Pulmonary/Chest: Effort normal and breath sounds normal. No respiratory distress. She has no wheezes. She has no rales. She exhibits no tenderness.  Abdominal: Soft. Bowel sounds are normal. She exhibits no distension and no mass. There is no tenderness. There is no rebound and no guarding.  Musculoskeletal: Normal range of motion. She exhibits no edema and no tenderness.  Lymphadenopathy:    She has no cervical adenopathy.  Neurological: She is alert and oriented to person, place, and time. She has normal reflexes. No cranial nerve deficit. She exhibits normal muscle  tone. Coordination normal.  Skin: Skin is warm and dry. No rash noted. No erythema.  Psychiatric: She has a normal mood and affect. Her behavior is normal. Judgment and thought content normal.          Assessment & Plan:  Well exam

## 2014-01-13 NOTE — Addendum Note (Signed)
Addended by: Gershon CraneFRY, STEPHEN A on: 01/13/2014 11:00 AM   Modules accepted: Orders

## 2014-01-18 ENCOUNTER — Other Ambulatory Visit: Payer: Self-pay

## 2014-01-18 DIAGNOSIS — Z1231 Encounter for screening mammogram for malignant neoplasm of breast: Secondary | ICD-10-CM

## 2014-02-15 ENCOUNTER — Ambulatory Visit
Admission: RE | Admit: 2014-02-15 | Discharge: 2014-02-15 | Disposition: A | Discharge status: Home / Self Care | Payer: BC Managed Care – PPO | Source: Ambulatory Visit

## 2014-02-15 DIAGNOSIS — Z1231 Encounter for screening mammogram for malignant neoplasm of breast: Secondary | ICD-10-CM

## 2014-03-17 ENCOUNTER — Encounter: Payer: Self-pay | Admitting: Family Medicine

## 2015-01-24 ENCOUNTER — Other Ambulatory Visit: Payer: Self-pay | Admitting: Family Medicine

## 2015-01-24 DIAGNOSIS — Z1231 Encounter for screening mammogram for malignant neoplasm of breast: Secondary | ICD-10-CM

## 2015-02-25 ENCOUNTER — Ambulatory Visit
Admission: RE | Admit: 2015-02-25 | Discharge: 2015-02-25 | Disposition: A | Discharge status: Home / Self Care | Payer: BC Managed Care – PPO | Source: Ambulatory Visit | Attending: Family Medicine | Admitting: Family Medicine

## 2015-02-25 DIAGNOSIS — Z1231 Encounter for screening mammogram for malignant neoplasm of breast: Secondary | ICD-10-CM

## 2015-06-02 ENCOUNTER — Ambulatory Visit (INDEPENDENT_AMBULATORY_CARE_PROVIDER_SITE_OTHER): Payer: BC Managed Care – PPO

## 2015-06-02 DIAGNOSIS — Z23 Encounter for immunization: Secondary | ICD-10-CM

## 2015-11-23 ENCOUNTER — Other Ambulatory Visit: Payer: Self-pay | Admitting: Orthopedic Surgery

## 2015-11-23 DIAGNOSIS — M542 Cervicalgia: Secondary | ICD-10-CM

## 2015-12-02 ENCOUNTER — Ambulatory Visit
Admission: RE | Admit: 2015-12-02 | Discharge: 2015-12-02 | Disposition: A | Discharge status: Home / Self Care | Payer: BC Managed Care – PPO | Source: Ambulatory Visit | Attending: Orthopedic Surgery | Admitting: Orthopedic Surgery

## 2015-12-02 DIAGNOSIS — M542 Cervicalgia: Secondary | ICD-10-CM

## 2016-01-11 ENCOUNTER — Other Ambulatory Visit: Payer: BC Managed Care – PPO

## 2016-01-18 ENCOUNTER — Encounter: Payer: Self-pay | Admitting: Family Medicine

## 2016-01-18 ENCOUNTER — Encounter: Payer: BC Managed Care – PPO | Admitting: Family Medicine

## 2016-02-06 ENCOUNTER — Other Ambulatory Visit (INDEPENDENT_AMBULATORY_CARE_PROVIDER_SITE_OTHER): Payer: BC Managed Care – PPO

## 2016-02-06 DIAGNOSIS — Z0000 Encounter for general adult medical examination without abnormal findings: Secondary | ICD-10-CM | POA: Diagnosis not present

## 2016-02-06 LAB — BASIC METABOLIC PANEL
BUN: 12 mg/dL (ref 6–23)
CALCIUM: 9.8 mg/dL (ref 8.4–10.5)
CO2: 29 meq/L (ref 19–32)
CREATININE: 0.82 mg/dL (ref 0.40–1.20)
Chloride: 102 mEq/L (ref 96–112)
GFR: 77.75 mL/min (ref >60.00)
Glucose, Bld: 85 mg/dL (ref 70–99)
Potassium: 3.4 mEq/L — ABNORMAL LOW (ref 3.5–5.1)
SODIUM: 139 meq/L (ref 135–145)

## 2016-02-06 LAB — CBC WITH DIFFERENTIAL/PLATELET
BASOS ABS: 0 10*3/uL (ref 0.0–0.1)
BASOS PCT: 0.6 % (ref 0.0–3.0)
EOS ABS: 0.1 10*3/uL (ref 0.0–0.7)
Eosinophils Relative: 1.9 % (ref 0.0–5.0)
HEMATOCRIT: 41.5 % (ref 36.0–46.0)
HEMOGLOBIN: 14.2 g/dL (ref 12.0–15.0)
LYMPHS PCT: 40 % (ref 12.0–46.0)
Lymphs Abs: 1.9 10*3/uL (ref 0.7–4.0)
MCHC: 34.2 g/dL (ref 30.0–36.0)
MCV: 94 fl (ref 78.0–100.0)
Monocytes Absolute: 0.5 10*3/uL (ref 0.1–1.0)
Monocytes Relative: 9.9 % (ref 3.0–12.0)
Neutro Abs: 2.3 10*3/uL (ref 1.4–7.7)
Neutrophils Relative %: 47.6 % (ref 43.0–77.0)
Platelets: 278 10*3/uL (ref 150.0–400.0)
RBC: 4.41 Mil/uL (ref 3.87–5.11)
RDW: 13.4 % (ref 11.5–15.5)
WBC: 4.9 10*3/uL (ref 4.0–10.5)

## 2016-02-06 LAB — POC URINALSYSI DIPSTICK (AUTOMATED)
Bilirubin, UA: NEGATIVE
Glucose, UA: NEGATIVE
KETONES UA: NEGATIVE
Leukocytes, UA: NEGATIVE
Nitrite, UA: NEGATIVE
PROTEIN UA: NEGATIVE
RBC UA: NEGATIVE
SPEC GRAV UA: 1.015
UROBILINOGEN UA: 0.2
pH, UA: 7

## 2016-02-06 LAB — LIPID PANEL
CHOL/HDL RATIO: 3
Cholesterol: 230 mg/dL — ABNORMAL HIGH (ref 0–200)
HDL: 66.1 mg/dL (ref >39.00)
LDL CALC: 142 mg/dL — AB (ref 0–99)
NONHDL: 163.94
Triglycerides: 109 mg/dL (ref 0.0–149.0)
VLDL: 21.8 mg/dL (ref 0.0–40.0)

## 2016-02-06 LAB — HEPATIC FUNCTION PANEL
ALK PHOS: 51 U/L (ref 39–117)
ALT: 26 U/L (ref 0–35)
AST: 23 U/L (ref 0–37)
Albumin: 4.4 g/dL (ref 3.5–5.2)
BILIRUBIN DIRECT: 0.1 mg/dL (ref 0.0–0.3)
BILIRUBIN TOTAL: 0.7 mg/dL (ref 0.2–1.2)
TOTAL PROTEIN: 7.5 g/dL (ref 6.0–8.3)

## 2016-02-06 LAB — TSH: TSH: 4.06 u[IU]/mL (ref 0.35–4.50)

## 2016-02-10 ENCOUNTER — Other Ambulatory Visit: Payer: Self-pay | Admitting: Family Medicine

## 2016-02-10 DIAGNOSIS — Z1231 Encounter for screening mammogram for malignant neoplasm of breast: Secondary | ICD-10-CM

## 2016-02-13 ENCOUNTER — Encounter: Payer: Self-pay | Admitting: Family Medicine

## 2016-02-13 ENCOUNTER — Ambulatory Visit (INDEPENDENT_AMBULATORY_CARE_PROVIDER_SITE_OTHER): Payer: BC Managed Care – PPO | Admitting: Family Medicine

## 2016-02-13 VITALS — BP 114/85 | HR 76 | Temp 98.3°F | Ht 62.75 in | Wt 140.0 lb

## 2016-02-13 DIAGNOSIS — Z0000 Encounter for general adult medical examination without abnormal findings: Secondary | ICD-10-CM | POA: Diagnosis not present

## 2016-02-13 DIAGNOSIS — Z23 Encounter for immunization: Secondary | ICD-10-CM

## 2016-02-13 NOTE — Progress Notes (Signed)
Pre visit review using our clinic review tool, if applicable. No additional management support is needed unless otherwise documented below in the visit note. 

## 2016-02-13 NOTE — Progress Notes (Signed)
   Subjective:    Patient ID: Kristen Farley, female    DOB: Feb 14, 1966, 50 y.o.   MRN: [id number]  HPI 50 yr old female for a well exam. She feels well although she has some hot flashes now. Her last menses was over a year ago. She gets yearly mammograms.    Review of Systems  Constitutional: Negative.   HENT: Negative.   Eyes: Negative.   Respiratory: Negative.   Cardiovascular: Negative.   Gastrointestinal: Negative.   Genitourinary: Negative for decreased urine volume, difficulty urinating, dyspareunia, dysuria, enuresis, flank pain, frequency, hematuria, pelvic pain and urgency.  Musculoskeletal: Negative.   Skin: Negative.   Neurological: Negative.   Psychiatric/Behavioral: Negative.        Objective:   Physical Exam  Constitutional: She is oriented to person, place, and time. She appears well-developed and well-nourished. No distress.  HENT:  Head: Normocephalic and atraumatic.  Right Ear: External ear normal.  Left Ear: External ear normal.  Nose: Nose normal.  Mouth/Throat: Oropharynx is clear and moist. No oropharyngeal exudate.  Eyes: Conjunctivae and EOM are normal. Pupils are equal, round, and reactive to light. No scleral icterus.  Neck: Normal range of motion. Neck supple. No JVD present. No thyromegaly present.  Cardiovascular: Normal rate, regular rhythm, normal heart sounds and intact distal pulses.  Exam reveals no gallop and no friction rub.   No murmur heard. EKG normal   Pulmonary/Chest: Effort normal and breath sounds normal. No respiratory distress. She has no wheezes. She has no rales. She exhibits no tenderness.  Abdominal: Soft. Bowel sounds are normal. She exhibits no distension and no mass. There is no tenderness. There is no rebound and no guarding.  Musculoskeletal: Normal range of motion. She exhibits no edema or tenderness.  Lymphadenopathy:    She has no cervical adenopathy.  Neurological: She is alert and oriented to person, place, and time.  She has normal reflexes. No cranial nerve deficit. She exhibits normal muscle tone. Coordination normal.  Skin: Skin is warm and dry. No rash noted. No erythema.  Psychiatric: She has a normal mood and affect. Her behavior is normal. Judgment and thought content normal.          Assessment & Plan:  Well exam. We discussed diet and exercise. Given a TDaP. I suggested she try black cohosh for the hot flashes. Set up a colonoscopy.  Nelwyn SalisburyFRY,STEPHEN A, MD

## 2016-02-13 NOTE — Addendum Note (Signed)
Addended by: Aniceto BossNIMMONS, Jamarrius Salay A on: 02/13/2016 12:04 PM   Modules accepted: Orders

## 2016-03-14 ENCOUNTER — Ambulatory Visit
Admission: RE | Admit: 2016-03-14 | Discharge: 2016-03-14 | Disposition: A | Discharge status: Home / Self Care | Payer: BC Managed Care – PPO | Source: Ambulatory Visit | Attending: Family Medicine | Admitting: Family Medicine

## 2016-03-14 DIAGNOSIS — Z1231 Encounter for screening mammogram for malignant neoplasm of breast: Secondary | ICD-10-CM

## 2016-03-16 ENCOUNTER — Encounter: Payer: Self-pay | Admitting: Gastroenterology

## 2016-04-30 ENCOUNTER — Ambulatory Visit (INDEPENDENT_AMBULATORY_CARE_PROVIDER_SITE_OTHER): Payer: BC Managed Care – PPO | Admitting: Family Medicine

## 2016-04-30 DIAGNOSIS — Z23 Encounter for immunization: Secondary | ICD-10-CM | POA: Diagnosis not present

## 2016-05-21 ENCOUNTER — Ambulatory Visit (AMBULATORY_SURGERY_CENTER): Payer: Self-pay

## 2016-05-21 VITALS — Ht 62.0 in | Wt 139.0 lb

## 2016-05-21 DIAGNOSIS — Z1211 Encounter for screening for malignant neoplasm of colon: Secondary | ICD-10-CM

## 2016-05-21 MED ORDER — SUPREP BOWEL PREP KIT 17.5-3.13-1.6 GM/177ML PO SOLN: 1.0000 | Freq: Once | ORAL | 0 refills | Status: AC

## 2016-05-21 NOTE — Progress Notes (Signed)
No allergies to eggs or soy No diet meds No home oxygen No past problems with anesthesia  Registered for emmi 

## 2016-05-22 ENCOUNTER — Encounter: Payer: Self-pay | Admitting: Gastroenterology

## 2016-06-04 ENCOUNTER — Ambulatory Visit (AMBULATORY_SURGERY_CENTER): Payer: BC Managed Care – PPO | Admitting: Gastroenterology

## 2016-06-04 ENCOUNTER — Encounter: Payer: Self-pay | Admitting: Gastroenterology

## 2016-06-04 VITALS — BP 127/88 | HR 70 | Temp 97.3°F | Resp 14 | Ht 62.0 in | Wt 139.0 lb

## 2016-06-04 DIAGNOSIS — Z1212 Encounter for screening for malignant neoplasm of rectum: Secondary | ICD-10-CM | POA: Diagnosis not present

## 2016-06-04 DIAGNOSIS — Z1211 Encounter for screening for malignant neoplasm of colon: Secondary | ICD-10-CM

## 2016-06-04 HISTORY — PX: COLONOSCOPY: SHX174

## 2016-06-04 MED ORDER — SODIUM CHLORIDE 0.9 % IV SOLN: 500.0000 mL | INTRAVENOUS | Status: AC

## 2016-06-04 NOTE — Progress Notes (Signed)
A/ox3 pleased with MAC, report to Jane RN 

## 2016-06-04 NOTE — Patient Instructions (Signed)
Impression/Recommendations:  Diverticulosis handout given to patient.  Repeat colonoscopy in 10 years for screening purposes.  YOU HAD AN ENDOSCOPIC PROCEDURE TODAY AT THE [facility]:   Refer to the procedure report that was given to you for any specific questions about what was found during the examination.  If the procedure report does not answer your questions, please call your gastroenterologist to clarify.  If you requested that your care partner not be given the details of your procedure findings, then the procedure report has been included in a sealed envelope for you to review at your convenience later.  YOU SHOULD EXPECT: Some feelings of bloating in the abdomen. Passage of more gas than usual.  Walking can help get rid of the air that was put into your GI tract during the procedure and reduce the bloating. If you had a lower endoscopy (such as a colonoscopy or flexible sigmoidoscopy) you may notice spotting of blood in your stool or on the toilet paper. If you underwent a bowel prep for your procedure, you may not have a normal bowel movement for a few days.  Please Note:  You might notice some irritation and congestion in your nose or some drainage.  This is from the oxygen used during your procedure.  There is no need for concern and it should clear up in a day or so.  SYMPTOMS TO REPORT IMMEDIATELY:   Following lower endoscopy (colonoscopy or flexible sigmoidoscopy):  Excessive amounts of blood in the stool  Significant tenderness or worsening of abdominal pains  Swelling of the abdomen that is new, acute  Fever of 100F or higher   For urgent or emergent issues, a gastroenterologist can be reached at any hour by calling [phone].   DIET:  We do recommend a small meal at first, but then you may proceed to your regular diet.  Drink plenty of fluids but you should avoid alcoholic beverages for 24 hours.  ACTIVITY:  You should plan to take it easy for the rest  of today and you should NOT DRIVE or use heavy machinery until tomorrow (because of the sedation medicines used during the test).    FOLLOW UP: Our staff will call the number listed on your records the next business day following your procedure to check on you and address any questions or concerns that you may have regarding the information given to you following your procedure. If we do not reach you, we will leave a message.  However, if you are feeling well and you are not experiencing any problems, there is no need to return our call.  We will assume that you have returned to your regular daily activities without incident.  If any biopsies were taken you will be contacted by phone or by letter within the next 1-3 weeks.  Please call us at [phone] if you have not heard about the biopsies in 3 weeks.    SIGNATURES/CONFIDENTIALITY: You and/or your care partner have signed paperwork which will be entered into your electronic medical record.  These signatures attest to the fact that that the information above on your After Visit Summary has been reviewed and is understood.  Full responsibility of the confidentiality of this discharge information lies with you and/or your care-partner. 

## 2016-06-04 NOTE — Op Note (Signed)
[facility] Patient Name: Kristen Farley Procedure Date: 06/04/2016 8:30 AM MRN: [id number] Endoscopist: Sherilyn CooterHenry L. Myrtie Neitheranis , MD Age: 50 Referring MD:  Date of Birth: 07-Nov-1965 Gender: Female Account #: [id number] Procedure:                Colonoscopy Indications:              Screening for colorectal malignant neoplasm, This                            is the patient's first colonoscopy Medicines:                Monitored Anesthesia Care Procedure:                Pre-Anesthesia Assessment:                           - Prior to the procedure, a History and Physical                            was performed, and patient medications and                            allergies were reviewed. The patient's tolerance of                            previous anesthesia was also reviewed. The risks                            and benefits of the procedure and the sedation                            options and risks were discussed with the patient.                            All questions were answered, and informed consent                            was obtained. Prior Anticoagulants: The patient has                            taken no previous anticoagulant or antiplatelet                            agents. ASA Grade Assessment: I - A normal, healthy                            patient. After reviewing the risks and benefits,                            the patient was deemed in satisfactory condition to                            undergo the procedure.  After obtaining informed consent, the colonoscope                            was passed under direct vision. Throughout the                            procedure, the patient's blood pressure, pulse, and                            oxygen saturations were monitored continuously. The                            Model CF-HQ190L [id number]) scope was introduced                            through the anus and advanced to the  the cecum,                            identified by appendiceal orifice and ileocecal                            valve. The ileocecal valve, appendiceal orifice,                            and rectum were photographed. The quality of the                            bowel preparation was excellent. The colonoscopy                            was performed without difficulty. The patient                            tolerated the procedure well. The bowel preparation                            used was SUPREP. The quality of the bowel                            preparation was evaluated using the BBPS [facility]                            Bowel Preparation Scale) with scores of: Right                            Colon = 3, Transverse Colon = 3 and Left Colon = 3                            (entire mucosa seen well with no residual staining,                            small fragments of stool or opaque liquid). The  total BBPS score equals 9. Scope In: 8:35:10 AM Scope Out: 8:48:45 AM Scope Withdrawal Time: 0 hours 10 minutes 28 seconds  Total Procedure Duration: 0 hours 13 minutes 35 seconds  Findings:                 The exam was otherwise without abnormality on                            direct and retroflexion views.                           A few small-mouthed diverticula were found in the                            left colon and right colon. Complications:            No immediate complications. Estimated blood loss:                            None. Estimated Blood Loss:     Estimated blood loss: none. Impression:               - The examination was otherwise normal on direct                            and retroflexion views.                           - Diverticulosis in the left colon and in the right                            colon.                           - No specimens collected. Recommendation:           - Repeat colonoscopy in 10 years for screening                             purposes.                           - Patient has a contact number available for                            emergencies. The signs and symptoms of potential                            delayed complications were discussed with the                            patient. Return to normal activities tomorrow.                            Written discharge instructions were provided to the  patient.                           - Resume previous diet.                           - Continue present medications. Henry L. Myrtie Neither, MD 06/04/2016 8:52:25 AM This report has been signed electronically.

## 2016-06-05 ENCOUNTER — Telehealth: Payer: Self-pay

## 2016-06-05 NOTE — Telephone Encounter (Signed)
  Follow up Call-  Call back number 06/04/2016  Post procedure Call Back phone  # [phone]  Permission to leave phone message Yes  Some recent data might be hidden     Patient questions:  Do you have a fever, pain , or abdominal swelling? No. Pain Score  0 *  Have you tolerated food without any problems? Yes.    Have you been able to return to your normal activities? Yes.    Do you have any questions about your discharge instructions: Diet   No. Medications  No. Follow up visit  No.  Do you have questions or concerns about your Care? No.  Actions: * If pain score is 4 or above: No action needed, pain <4.

## 2016-11-01 IMAGING — MG 2D DIGITAL SCREENING BILATERAL MAMMOGRAM WITH CAD AND ADJUNCT TO
9 of 12 series · 9 of 28 positions shown · non-contrast
Comparison: Previous exam(s).

CLINICAL DATA: Screening.

EXAM:
2D DIGITAL SCREENING BILATERAL MAMMOGRAM WITH CAD AND ADJUNCT TOMO

[R CC synth-2D]
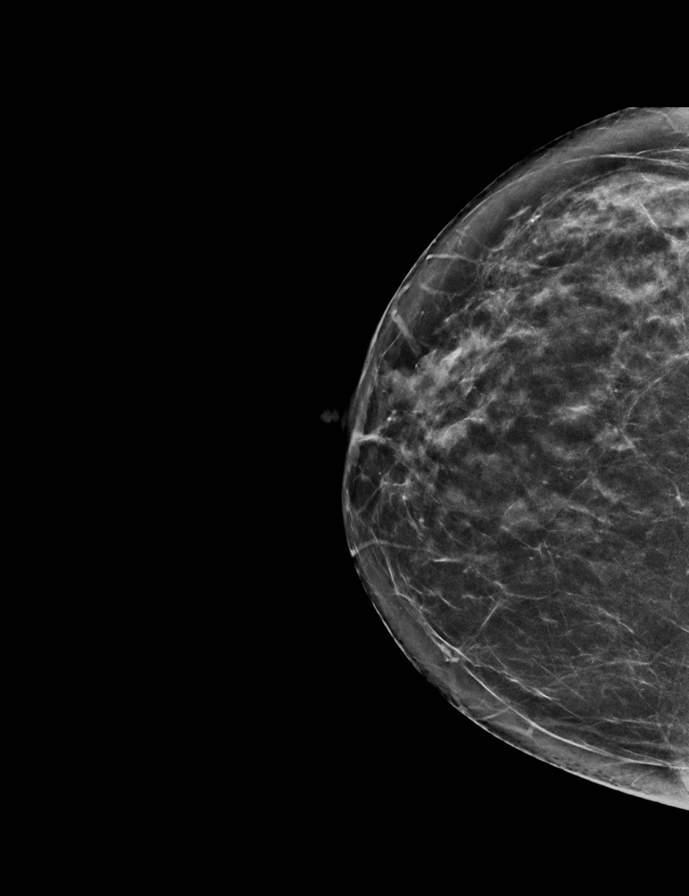

[R CC]
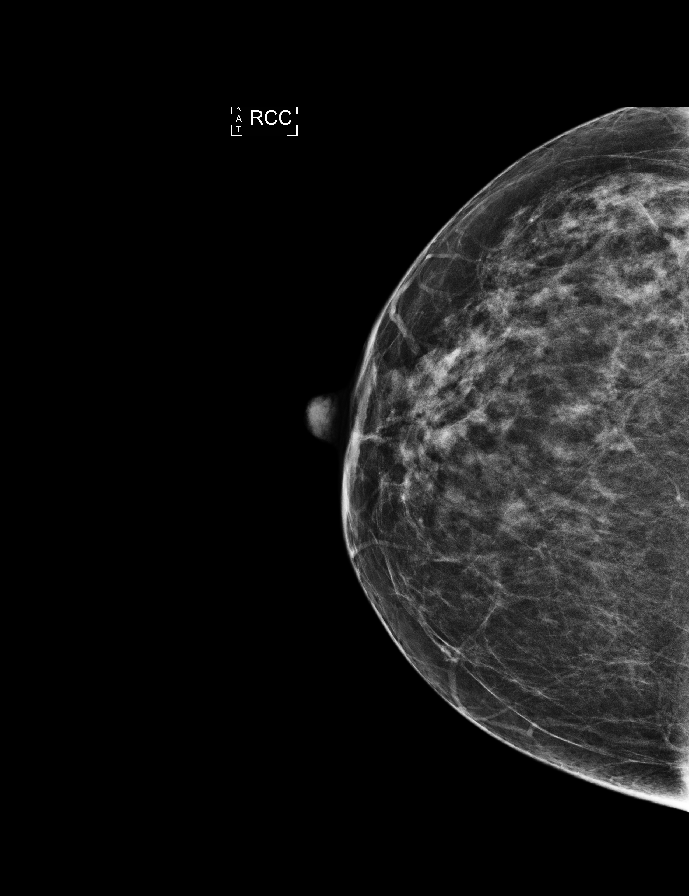

[L CC synth-2D]
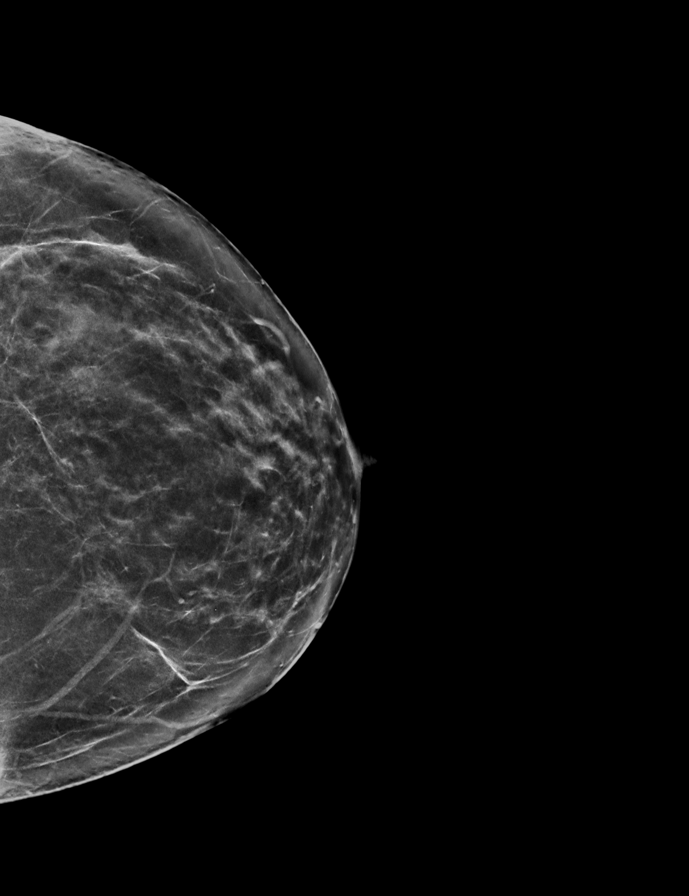

[L CC]
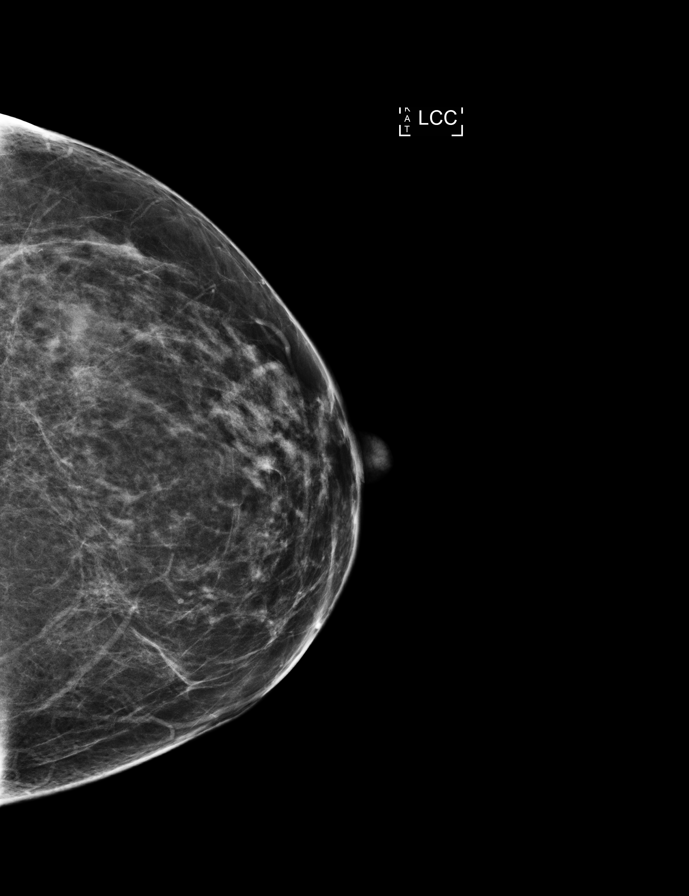

[R MLO synth-2D]
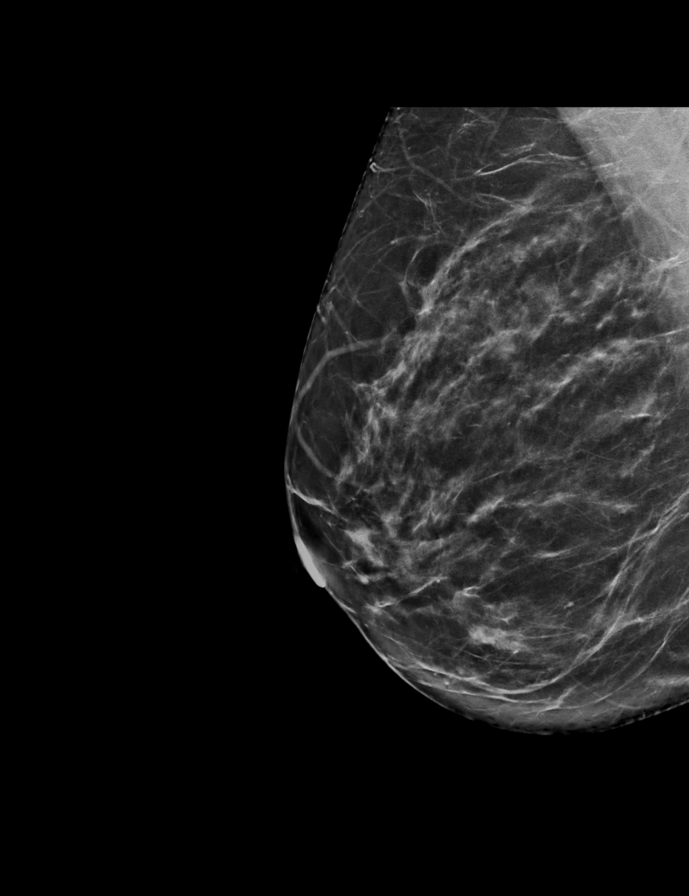

[L MLO synth-2D]
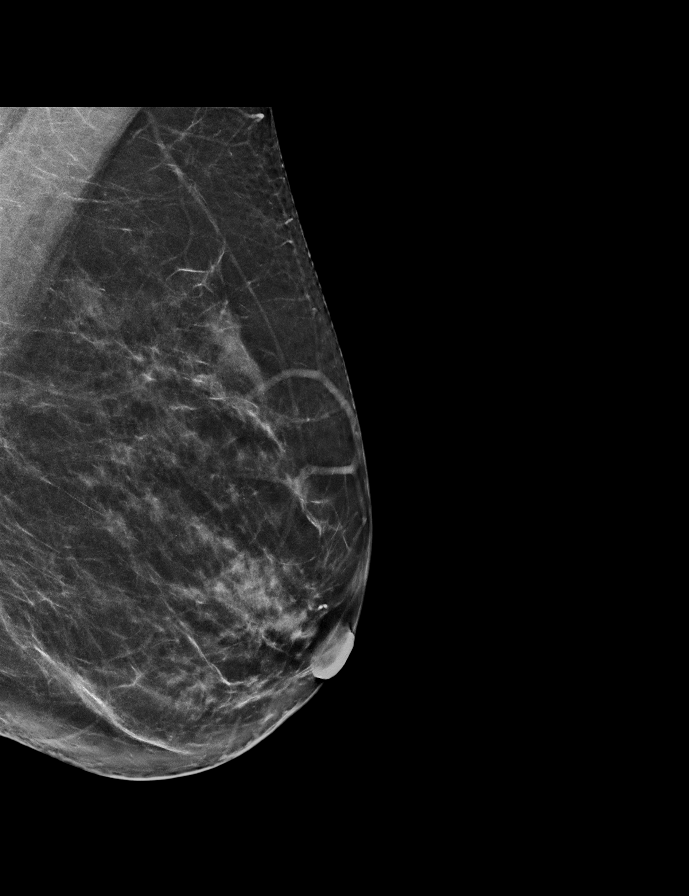

[R MLO]
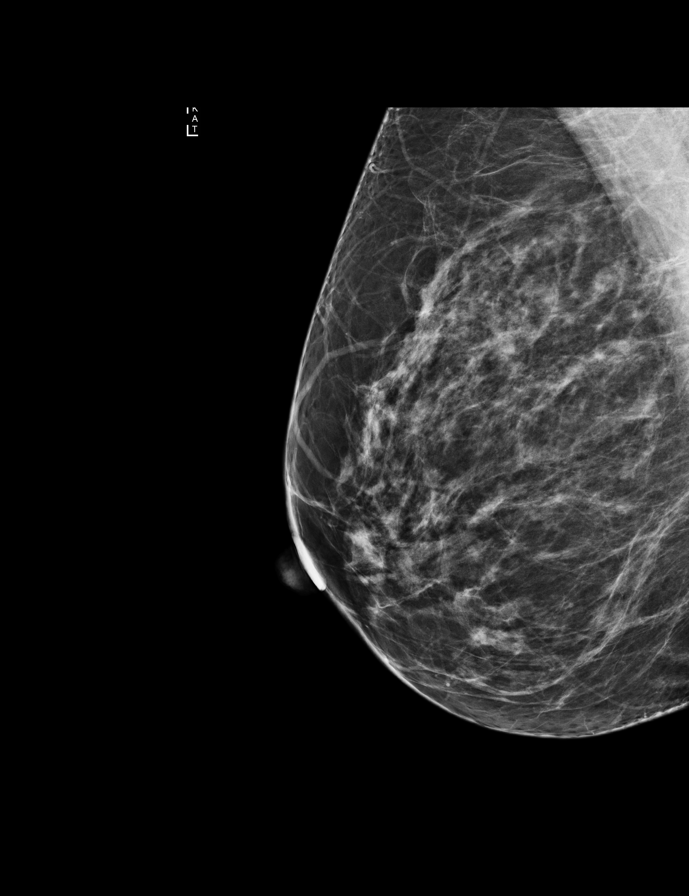

[L MLO]
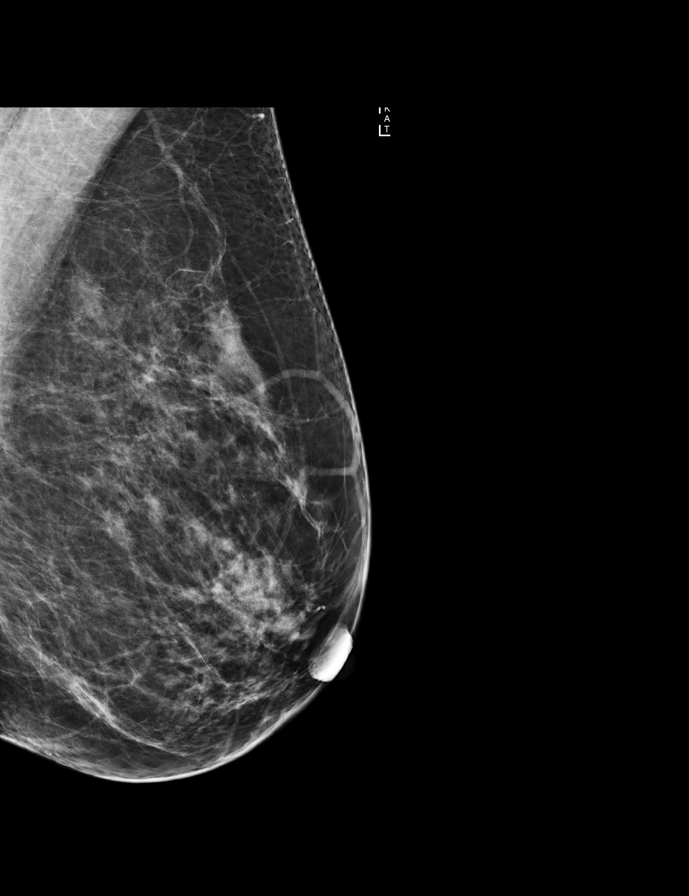

[L CC tomo · tomo slice 33/66.0]
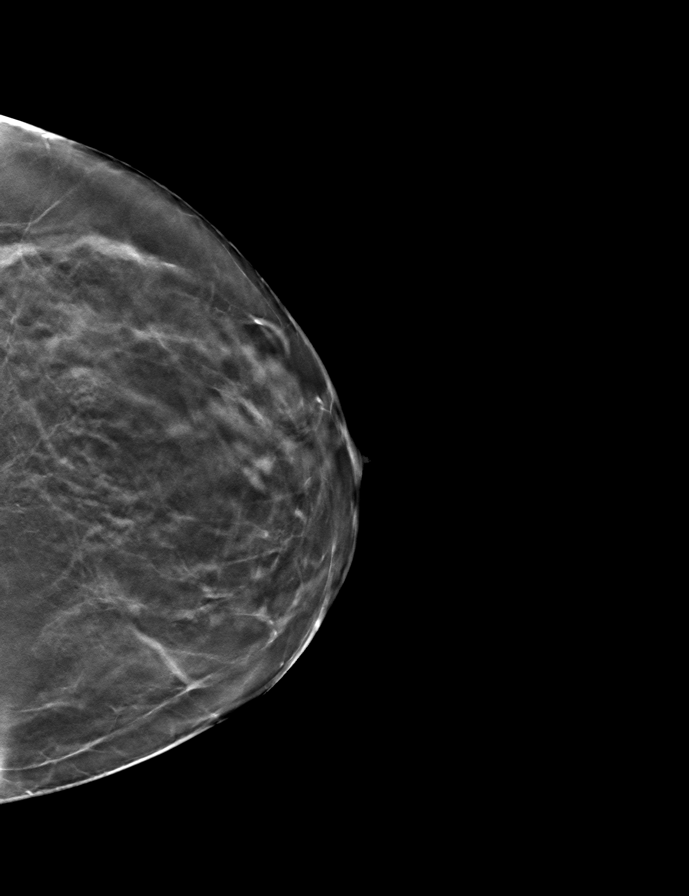

[9 of 28 positions shown; findings below may reference images not displayed]

ACR Breast Density Category c: The breast tissue is heterogeneously
dense, which may obscure small masses.
FINDINGS: There are no findings suspicious for malignancy. Images were
processed with CAD.
IMPRESSION: No mammographic evidence of malignancy. A result letter of this
screening mammogram will be mailed directly to the patient.

RECOMMENDATION:
Screening mammogram in one year. (Code:TN-[id number])

BI-RADS CATEGORY  1: Negative.

## 2016-12-19 ENCOUNTER — Encounter: Payer: BC Managed Care – PPO | Admitting: Family Medicine

## 2017-01-16 ENCOUNTER — Encounter: Payer: BC Managed Care – PPO | Admitting: Family Medicine

## 2017-02-20 ENCOUNTER — Ambulatory Visit (INDEPENDENT_AMBULATORY_CARE_PROVIDER_SITE_OTHER): Payer: BC Managed Care – PPO | Admitting: Family Medicine

## 2017-02-20 ENCOUNTER — Encounter: Payer: Self-pay | Admitting: Family Medicine

## 2017-02-20 VITALS — BP 127/87 | HR 70 | Temp 97.9°F | Ht 62.0 in | Wt 142.0 lb

## 2017-02-20 DIAGNOSIS — Z23 Encounter for immunization: Secondary | ICD-10-CM | POA: Diagnosis not present

## 2017-02-20 DIAGNOSIS — Z0000 Encounter for general adult medical examination without abnormal findings: Secondary | ICD-10-CM | POA: Diagnosis not present

## 2017-02-20 LAB — LIPID PANEL
CHOL/HDL RATIO: 4
Cholesterol: 212 mg/dL — ABNORMAL HIGH (ref 0–200)
HDL: 57.2 mg/dL (ref >39.00)
LDL CALC: 129 mg/dL — AB (ref 0–99)
NonHDL: 154.31
Triglycerides: 126 mg/dL (ref 0.0–149.0)
VLDL: 25.2 mg/dL (ref 0.0–40.0)

## 2017-02-20 LAB — CBC WITH DIFFERENTIAL/PLATELET
BASOS ABS: 0 10*3/uL (ref 0.0–0.1)
Basophils Relative: 0.7 % (ref 0.0–3.0)
Eosinophils Absolute: 0.1 10*3/uL (ref 0.0–0.7)
Eosinophils Relative: 2.1 % (ref 0.0–5.0)
HEMATOCRIT: 42.7 % (ref 36.0–46.0)
Hemoglobin: 14.3 g/dL (ref 12.0–15.0)
LYMPHS PCT: 32.2 % (ref 12.0–46.0)
Lymphs Abs: 1.3 10*3/uL (ref 0.7–4.0)
MCHC: 33.5 g/dL (ref 30.0–36.0)
MCV: 97.2 fl (ref 78.0–100.0)
MONOS PCT: 10.1 % (ref 3.0–12.0)
Monocytes Absolute: 0.4 10*3/uL (ref 0.1–1.0)
Neutro Abs: 2.3 10*3/uL (ref 1.4–7.7)
Neutrophils Relative %: 54.9 % (ref 43.0–77.0)
Platelets: 256 10*3/uL (ref 150.0–400.0)
RBC: 4.39 Mil/uL (ref 3.87–5.11)
RDW: 13.4 % (ref 11.5–15.5)
WBC: 4.2 10*3/uL (ref 4.0–10.5)

## 2017-02-20 LAB — POC URINALSYSI DIPSTICK (AUTOMATED)
Bilirubin, UA: NEGATIVE
CLARITY UA: NEGATIVE
Glucose, UA: NEGATIVE
Ketones, UA: NEGATIVE
Nitrite, UA: NEGATIVE
PROTEIN UA: NEGATIVE
RBC UA: NEGATIVE
SPEC GRAV UA: 1.015 (ref 1.010–1.025)
UROBILINOGEN UA: 0.2 U/dL
pH, UA: 6 (ref 5.0–8.0)

## 2017-02-20 LAB — HEPATIC FUNCTION PANEL
ALT: 26 U/L (ref 0–35)
AST: 19 U/L (ref 0–37)
Albumin: 4.4 g/dL (ref 3.5–5.2)
Alkaline Phosphatase: 56 U/L (ref 39–117)
BILIRUBIN DIRECT: 0.1 mg/dL (ref 0.0–0.3)
TOTAL PROTEIN: 7.2 g/dL (ref 6.0–8.3)
Total Bilirubin: 0.7 mg/dL (ref 0.2–1.2)

## 2017-02-20 LAB — BASIC METABOLIC PANEL
BUN: 19 mg/dL (ref 6–23)
CALCIUM: 9.5 mg/dL (ref 8.4–10.5)
CO2: 32 mEq/L (ref 19–32)
Chloride: 102 mEq/L (ref 96–112)
Creatinine, Ser: 0.82 mg/dL (ref 0.40–1.20)
GFR: 77.44 mL/min (ref >60.00)
Glucose, Bld: 94 mg/dL (ref 70–99)
Potassium: 4.1 mEq/L (ref 3.5–5.1)
SODIUM: 140 meq/L (ref 135–145)

## 2017-02-20 LAB — TSH: TSH: 2.95 u[IU]/mL (ref 0.35–4.50)

## 2017-02-20 NOTE — Progress Notes (Signed)
   Subjective:    Patient ID: Kristen Farley, female    DOB: 04-22-1966, 51 y.o.   MRN: [id number]  HPI Here for a well exam. She feels great.    Review of Systems  Constitutional: Negative.   HENT: Negative.   Eyes: Negative.   Respiratory: Negative.   Cardiovascular: Negative.   Gastrointestinal: Negative.   Genitourinary: Negative for decreased urine volume, difficulty urinating, dyspareunia, dysuria, enuresis, flank pain, frequency, hematuria, pelvic pain and urgency.  Musculoskeletal: Negative.   Skin: Negative.   Neurological: Negative.   Psychiatric/Behavioral: Negative.        Objective:   Physical Exam  Constitutional: She is oriented to person, place, and time. She appears well-developed and well-nourished. No distress.  HENT:  Head: Normocephalic and atraumatic.  Right Ear: External ear normal.  Left Ear: External ear normal.  Nose: Nose normal.  Mouth/Throat: Oropharynx is clear and moist. No oropharyngeal exudate.  Eyes: Pupils are equal, round, and reactive to light. Conjunctivae and EOM are normal. No scleral icterus.  Neck: Normal range of motion. Neck supple. No JVD present. No thyromegaly present.  Cardiovascular: Normal rate, regular rhythm, normal heart sounds and intact distal pulses.  Exam reveals no gallop and no friction rub.   No murmur heard. Pulmonary/Chest: Effort normal and breath sounds normal. No respiratory distress. She has no wheezes. She has no rales. She exhibits no tenderness.  Abdominal: Soft. Bowel sounds are normal. She exhibits no distension and no mass. There is no tenderness. There is no rebound and no guarding.  Musculoskeletal: Normal range of motion. She exhibits no edema or tenderness.  Lymphadenopathy:    She has no cervical adenopathy.  Neurological: She is alert and oriented to person, place, and time. She has normal reflexes. No cranial nerve deficit. She exhibits normal muscle tone. Coordination normal.  Skin: Skin is warm and  dry. No rash noted. No erythema.  Psychiatric: She has a normal mood and affect. Her behavior is normal. Judgment and thought content normal.          Assessment & Plan:  Well exam. We discussed diet and exercise. Get fasting labs. Gershon Crane, MD

## 2017-02-20 NOTE — Patient Instructions (Signed)
WE NOW OFFER   [facility] FAST TRACK!!!  SAME DAY Appointments for ACUTE CARE  Such as: Sprains, Injuries, cuts, abrasions, rashes, muscle pain, joint pain, back pain Colds, flu, sore throats, headache, allergies, cough, fever  Ear pain, sinus and eye infections Abdominal pain, nausea, vomiting, diarrhea, upset stomach Animal/insect bites  3 Easy Ways to Schedule: Walk-In Scheduling Call in scheduling Mychart Sign-up: [email or web address]         

## 2017-03-21 ENCOUNTER — Encounter: Payer: Self-pay | Admitting: Family Medicine

## 2018-04-28 ENCOUNTER — Ambulatory Visit (INDEPENDENT_AMBULATORY_CARE_PROVIDER_SITE_OTHER): Payer: BC Managed Care – PPO

## 2018-04-28 DIAGNOSIS — Z23 Encounter for immunization: Secondary | ICD-10-CM

## 2018-04-29 DIAGNOSIS — Z23 Encounter for immunization: Secondary | ICD-10-CM

## 2019-01-06 ENCOUNTER — Encounter: Payer: Self-pay | Admitting: Family Medicine

## 2019-01-06 ENCOUNTER — Other Ambulatory Visit: Payer: Self-pay

## 2019-01-06 ENCOUNTER — Ambulatory Visit (INDEPENDENT_AMBULATORY_CARE_PROVIDER_SITE_OTHER): Payer: BC Managed Care – PPO | Admitting: Family Medicine

## 2019-01-06 ENCOUNTER — Ambulatory Visit: Payer: Self-pay | Admitting: *Deleted

## 2019-01-06 DIAGNOSIS — Z209 Contact with and (suspected) exposure to unspecified communicable disease: Secondary | ICD-10-CM

## 2019-01-06 DIAGNOSIS — Z20828 Contact with and (suspected) exposure to other viral communicable diseases: Secondary | ICD-10-CM

## 2019-01-06 NOTE — Telephone Encounter (Signed)
Pt has been scheduled for today by tes °

## 2019-01-06 NOTE — Telephone Encounter (Signed)
Will need virtual visit  

## 2019-01-06 NOTE — Telephone Encounter (Addendum)
Pt called stating that she and her family were exposed to a person who tested positive for COVID; she denies fever, cough, or SOB; they were with the affected person 12/02/18 - /12/28/18 in [facility]; the pt would like to be tested; recommendations made per nurse triage protocol; she verbalizes understanding; the pt can be contacted at [phone]; she sees Dr Alysia Penna, [facility]; will route to office for final disposition.  Reason for Disposition . [1] COVID-19 EXPOSURE (Close Contact) AND [2] within last 14 days BUT [3] NO symptoms  Answer Assessment - Initial Assessment Questions 1. CLOSE CONTACT: "Who is the person with the confirmed or suspected COVID-19 infection that you were exposed to?"     friend 2. PLACE of CONTACT: "Where were you when you were exposed to COVID-19?" (e.g., home, school, medical waiting room; which city?)     [place]  3. TYPE of CONTACT: "How much contact was there?" (e.g., sitting next to, live in same house, work in same office, same building)   Same house 4. DURATION of CONTACT: "How long were you in contact with the COVID-19 patient?" (e.g., a few seconds, passed by person, a few minutes, live with the patient)     days 5. DATE of CONTACT: "When did you have contact with a COVID-19 patient?" (e.g., how many days ago)     12/23/2018-12/28/2018 6. TRAVEL: "Have you traveled out of the country recently?" If so, "When and where?"     * Also ask about out-of-state travel, since the CDC has identified some high-risk cities for community spread in the [place].     * Note: Travel becomes less relevant if there is widespread community transmission where the patient lives.     no 7. COMMUNITY SPREAD: "Are there lots of cases of COVID-19 (community spread) where you live?" (See public health department website, if unsure)       Major community spread 8. SYMPTOMS: "Do you have any symptoms?" (e.g., fever, cough, breathing difficulty)    no 9. PREGNANCY OR POSTPARTUM: "Is there any  chance you are pregnant?" "When was your last menstrual period?" "Did you deliver in the last 2 weeks?"    10. HIGH RISK: "Do you have any heart or lung problems? Do you have a weak immune system?" (e.g., CHF, COPD, asthma, HIV positive, chemotherapy, renal failure, diabetes mellitus, sickle cell anemia)      no  Protocols used: CORONAVIRUS (COVID-19) EXPOSURE-A-AH
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
## 2019-01-07 ENCOUNTER — Other Ambulatory Visit: Payer: Self-pay

## 2019-01-07 ENCOUNTER — Telehealth: Payer: Self-pay | Admitting: *Deleted

## 2019-01-07 DIAGNOSIS — Z20822 Contact with and (suspected) exposure to covid-19: Secondary | ICD-10-CM

## 2019-01-07 NOTE — Telephone Encounter (Signed)
Pt scheduled today @ [facility] @ 2:00. Instructions given and order placed

## 2019-01-07 NOTE — Telephone Encounter (Signed)
-----   Message from Elie Confer, [place] sent at 01/06/2019  4:14 PM EDT ----- Good afternoon!!!  Dr. Sarajane Jews would like to have this patient tested for Covid 19.  Her sister in law tested positive and  they were all at the beach and in the same condo together.  The rest of her family is being tested as well. Thanks

## 2019-01-12 LAB — NOVEL CORONAVIRUS, NAA: SARS-CoV-2, NAA: NOT DETECTED

## 2019-02-04 ENCOUNTER — Other Ambulatory Visit: Payer: Self-pay | Admitting: Family Medicine

## 2019-02-04 ENCOUNTER — Other Ambulatory Visit: Payer: Self-pay

## 2019-02-04 ENCOUNTER — Ambulatory Visit
Admission: RE | Admit: 2019-02-04 | Discharge: 2019-02-04 | Disposition: A | Discharge status: Home / Self Care | Payer: BC Managed Care – PPO | Source: Ambulatory Visit | Attending: Family Medicine | Admitting: Family Medicine

## 2019-02-04 DIAGNOSIS — Z1231 Encounter for screening mammogram for malignant neoplasm of breast: Secondary | ICD-10-CM

## 2019-05-14 ENCOUNTER — Other Ambulatory Visit: Payer: Self-pay

## 2019-05-14 ENCOUNTER — Ambulatory Visit: Payer: BC Managed Care – PPO

## 2019-05-14 ENCOUNTER — Ambulatory Visit (INDEPENDENT_AMBULATORY_CARE_PROVIDER_SITE_OTHER): Payer: BC Managed Care – PPO | Admitting: *Deleted

## 2019-05-14 DIAGNOSIS — Z23 Encounter for immunization: Secondary | ICD-10-CM

## 2019-07-11 ENCOUNTER — Encounter: Payer: Self-pay | Admitting: Family Medicine

## 2019-07-13 NOTE — Telephone Encounter (Signed)
Have her go to [facility]

## 2019-07-13 NOTE — Telephone Encounter (Signed)
Does the patient need an OV?

## 2019-07-14 ENCOUNTER — Ambulatory Visit: Payer: BC Managed Care – PPO | Attending: Internal Medicine

## 2019-07-14 DIAGNOSIS — Z20822 Contact with and (suspected) exposure to covid-19: Secondary | ICD-10-CM

## 2019-07-16 LAB — NOVEL CORONAVIRUS, NAA: SARS-CoV-2, NAA: DETECTED — AB

## 2019-08-29 ENCOUNTER — Ambulatory Visit: Payer: BC Managed Care – PPO | Attending: Internal Medicine

## 2019-08-29 DIAGNOSIS — Z23 Encounter for immunization: Secondary | ICD-10-CM

## 2019-08-29 NOTE — Progress Notes (Signed)
   Covid-19 Vaccination Clinic  Name:  Kristen Farley    MRN: [id number] DOB: 1965/10/27  08/29/2019  Kristen Farley was observed post Covid-19 immunization for 15 minutes without incidence. She was provided with Vaccine Information Sheet and instruction to access the V-Safe system.   Kristen Farley was instructed to call 911 with any severe reactions post vaccine: Marland Kitchen Difficulty breathing  . Swelling of your face and throat  . A fast heartbeat  . A bad rash all over your body  . Dizziness and weakness    Immunizations Administered    Name Date Dose VIS Date Route   Pfizer COVID-19 Vaccine 08/29/2019  1:19 PM 0.3 mL 06/12/2019 Intramuscular   Manufacturer: [employer], [employer]   Lot: [id number]   NDC: [address]-2

## 2019-09-19 ENCOUNTER — Ambulatory Visit: Payer: BC Managed Care – PPO | Attending: Internal Medicine

## 2019-09-19 DIAGNOSIS — Z23 Encounter for immunization: Secondary | ICD-10-CM

## 2019-09-19 NOTE — Progress Notes (Signed)
   Covid-19 Vaccination Clinic  Name:  Kristen Farley    MRN: [id number] DOB: [birth year]  09/19/2019  [name] was observed post Covid-19 immunization for 15 minutes without incident. She was provided with Vaccine Information Sheet and instruction to access the V-Safe system.   [name] was instructed to call 911 with any severe reactions post vaccine: Marland Kitchen Difficulty breathing  . Swelling of face and throat  . A fast heartbeat  . A bad rash all over body  . Dizziness and weakness   Immunizations Administered    Name Date Dose VIS Date Route   Pfizer COVID-19 Vaccine 09/19/2019  8:17 AM 0.3 mL 06/12/2019 Intramuscular   Manufacturer: [employer], [employer]   Lot: [id number]   NDC: [address]-1

## 2020-01-08 ENCOUNTER — Other Ambulatory Visit: Payer: Self-pay | Admitting: Family Medicine

## 2020-01-08 DIAGNOSIS — Z1231 Encounter for screening mammogram for malignant neoplasm of breast: Secondary | ICD-10-CM

## 2020-02-10 ENCOUNTER — Ambulatory Visit
Admission: RE | Admit: 2020-02-10 | Discharge: 2020-02-10 | Disposition: A | Discharge status: Home / Self Care | Payer: BC Managed Care – PPO | Source: Ambulatory Visit | Attending: Family Medicine | Admitting: Family Medicine

## 2020-02-10 ENCOUNTER — Other Ambulatory Visit: Payer: Self-pay

## 2020-02-10 ENCOUNTER — Ambulatory Visit: Payer: BC Managed Care – PPO

## 2020-02-10 DIAGNOSIS — Z1231 Encounter for screening mammogram for malignant neoplasm of breast: Secondary | ICD-10-CM

## 2020-05-14 ENCOUNTER — Ambulatory Visit: Payer: BC Managed Care – PPO | Attending: Internal Medicine

## 2020-05-14 DIAGNOSIS — Z23 Encounter for immunization: Secondary | ICD-10-CM

## 2020-05-14 NOTE — Progress Notes (Signed)
° °  Covid-19 Vaccination Clinic  Name:  Kristen Farley    MRN: [id number] DOB: 03-16-1966  05/14/2020  Kristen Farley was observed post Covid-19 immunization for 15 minutes without incident. She was provided with Vaccine Information Sheet and instruction to access the V-Safe system.   Kristen Farley was instructed to call 911 with any severe reactions post vaccine:  Difficulty breathing   Swelling of face and throat   A fast heartbeat   A bad rash all over body   Dizziness and weakness

## 2021-01-13 ENCOUNTER — Other Ambulatory Visit: Payer: Self-pay | Admitting: Family Medicine

## 2021-01-13 DIAGNOSIS — Z1231 Encounter for screening mammogram for malignant neoplasm of breast: Secondary | ICD-10-CM

## 2021-04-05 ENCOUNTER — Other Ambulatory Visit: Payer: Self-pay

## 2021-04-05 ENCOUNTER — Ambulatory Visit
Admission: RE | Admit: 2021-04-05 | Discharge: 2021-04-05 | Disposition: A | Discharge status: Home / Self Care | Payer: BC Managed Care – PPO | Source: Ambulatory Visit | Attending: Family Medicine | Admitting: Family Medicine

## 2021-04-05 DIAGNOSIS — Z1231 Encounter for screening mammogram for malignant neoplasm of breast: Secondary | ICD-10-CM

## 2021-06-06 ENCOUNTER — Telehealth: Payer: BC Managed Care – PPO | Admitting: Family Medicine

## 2021-06-06 DIAGNOSIS — R059 Cough, unspecified: Secondary | ICD-10-CM

## 2021-06-06 DIAGNOSIS — H5789 Other specified disorders of eye and adnexa: Secondary | ICD-10-CM

## 2021-06-06 DIAGNOSIS — R0981 Nasal congestion: Secondary | ICD-10-CM

## 2021-06-06 MED ORDER — BENZONATATE 100 MG PO CAPS: ORAL_CAPSULE | ORAL | 0 refills | Status: AC

## 2021-06-06 MED ORDER — ERYTHROMYCIN 5 MG/GM OP OINT: 1.0000 "application " | TOPICAL_OINTMENT | Freq: Three times a day (TID) | OPHTHALMIC | 0 refills | Status: AC

## 2021-06-06 NOTE — Patient Instructions (Addendum)
   ---------------------------------------------------------------------------------------------------------------------------      WORK SLIP:  Patient Kristen Farley,  August 30, 1965, was seen for a medical visit today, 06/06/21 . Please excuse from work for a COVID/flu like illness. If Covid19 testing is positive advise 10 days minimum from the onset of symptoms (06/02/21) PLUS 1 day of no fever and improved symptoms. Will defer to employer for a sooner return to work if patient has 2 negative covid tests 48 hours apart and is feeling better, or if symptoms have resolved, it is greater than 5 days since the positive test and the patient can wear a high-quality, tight fitting mask such as N95 or KN95 at all times for an additional 5 days.   Sincerely: E-signature: Dr. Kriste Basque, DO [facility] Ph: [phone]   ------------------------------------------------------------------------------------------------------------------------------    HOME CARE TIPS:  -COVID19 testing information: [email or web address].[email or web address]  Most pharmacies also offer testing and home test kits. If the Covid19 test is positive and you desire antiviral treatment, please contact a [place] pharmacy or schedule a follow up virtual visit through your primary care office or through the [employer].  Other test to treat options: http://www.[email or web address]-[email or web address].[email or web address]_[email or web address]  -I sent the medication(s) we discussed to your pharmacy: Meds ordered this encounter  Medications   erythromycin ophthalmic ointment    Sig: Place 1 application into the right eye 3 (three) times daily.    Dispense:  3.5 g    Refill:  0   benzonatate (TESSALON PERLES) 100 MG capsule    Sig: 1-2 capsules up to twice daily as needed for cough    Dispense:  30 capsule    Refill:  0     -can use tylenol or aleve if needed for fevers, aches and pains per instructions  -can use nasal  saline a few times per day if you have nasal congestion; sometimes  a short course of Afrin nasal spray for 3 days can help with symptoms as well  -stay hydrated, drink plenty of fluids and eat small healthy meals - avoid dairy  -can take 1000 IU ( ) Vit D3 and 100-500 mg of Vit C daily per instructions  -If the Covid test is positive, check out the CDC website for more information on home care, transmission and treatment for COVID19  -follow up with your doctor in 2-3 days unless improving and feeling better  -stay home while sick, except to seek medical care.   It was nice to meet you today, and I really hope you are feeling better soon. I help [facility] out with telemedicine visits on Tuesdays and Thursdays and am available for visits on those days. If you have any concerns or questions following this visit please schedule a follow up visit with your Primary Care doctor or seek care at a local urgent care clinic to avoid delays in care.    Seek in person care or schedule a follow up video visit promptly if your symptoms worsen, new concerns arise or you are not improving with treatment. Call 911 and/or seek emergency care if your symptoms are severe or life threatening.
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
## 2023-12-18 ENCOUNTER — Other Ambulatory Visit: Payer: Self-pay | Admitting: Family Medicine

## 2023-12-18 DIAGNOSIS — Z1231 Encounter for screening mammogram for malignant neoplasm of breast: Secondary | ICD-10-CM

## 2023-12-20 ENCOUNTER — Ambulatory Visit
Admission: RE | Admit: 2023-12-20 | Discharge: 2023-12-20 | Disposition: A | Discharge status: Home / Self Care | Payer: Self-pay | Source: Ambulatory Visit | Attending: Family Medicine | Admitting: Family Medicine

## 2023-12-20 DIAGNOSIS — Z1231 Encounter for screening mammogram for malignant neoplasm of breast: Secondary | ICD-10-CM

## 2024-05-27 ENCOUNTER — Ambulatory Visit (INDEPENDENT_AMBULATORY_CARE_PROVIDER_SITE_OTHER)

## 2024-05-27 DIAGNOSIS — Z23 Encounter for immunization: Secondary | ICD-10-CM
